# Patient Record
Sex: Female | Born: 1990 | Race: White | Hispanic: No | Marital: Married | Smoking: Former smoker
Health system: Southern US, Community
[De-identification: ages and names within clinical notes are randomized; demographics above are authoritative.]

## PROBLEM LIST (undated history)

## (undated) ENCOUNTER — Inpatient Hospital Stay (HOSPITAL_COMMUNITY): Payer: Self-pay

## (undated) ENCOUNTER — Inpatient Hospital Stay (HOSPITAL_COMMUNITY): Payer: Managed Care, Other (non HMO)

## (undated) DIAGNOSIS — I10 Essential (primary) hypertension: Secondary | ICD-10-CM

## (undated) DIAGNOSIS — Z789 Other specified health status: Secondary | ICD-10-CM

## (undated) HISTORY — PX: CHOLECYSTECTOMY: SHX55

## (undated) HISTORY — PX: NO PAST SURGERIES: SHX2092

---

## 2013-03-30 ENCOUNTER — Ambulatory Visit: Payer: Self-pay | Admitting: Nurse Practitioner

## 2013-04-28 ENCOUNTER — Ambulatory Visit: Payer: Self-pay | Admitting: Nurse Practitioner

## 2016-05-30 LAB — OB RESULTS CONSOLE ABO/RH: RH TYPE: POSITIVE

## 2016-05-30 LAB — OB RESULTS CONSOLE RUBELLA ANTIBODY, IGM: Rubella: IMMUNE

## 2016-05-30 LAB — OB RESULTS CONSOLE GC/CHLAMYDIA
Chlamydia: NEGATIVE
GC PROBE AMP, GENITAL: NEGATIVE

## 2016-05-30 LAB — OB RESULTS CONSOLE ANTIBODY SCREEN: Antibody Screen: NEGATIVE

## 2016-05-30 LAB — OB RESULTS CONSOLE HEPATITIS B SURFACE ANTIGEN: Hepatitis B Surface Ag: NEGATIVE

## 2016-05-30 LAB — OB RESULTS CONSOLE HIV ANTIBODY (ROUTINE TESTING): HIV: NONREACTIVE

## 2016-05-30 LAB — OB RESULTS CONSOLE RPR: RPR: NONREACTIVE

## 2016-09-21 ENCOUNTER — Encounter (HOSPITAL_COMMUNITY): Payer: Self-pay | Admitting: *Deleted

## 2016-09-21 ENCOUNTER — Inpatient Hospital Stay (HOSPITAL_COMMUNITY)
Admission: AD | Admit: 2016-09-21 | Discharge: 2016-09-21 | Disposition: A | Discharge status: Home / Self Care | Payer: Managed Care, Other (non HMO) | Source: Ambulatory Visit | Attending: Obstetrics and Gynecology | Admitting: Obstetrics and Gynecology

## 2016-09-21 DIAGNOSIS — Z87891 Personal history of nicotine dependence: Secondary | ICD-10-CM | POA: Diagnosis not present

## 2016-09-21 DIAGNOSIS — Z3A24 24 weeks gestation of pregnancy: Secondary | ICD-10-CM | POA: Diagnosis not present

## 2016-09-21 DIAGNOSIS — N898 Other specified noninflammatory disorders of vagina: Secondary | ICD-10-CM | POA: Diagnosis not present

## 2016-09-21 DIAGNOSIS — O26892 Other specified pregnancy related conditions, second trimester: Secondary | ICD-10-CM | POA: Diagnosis not present

## 2016-09-21 HISTORY — DX: Other specified health status: Z78.9

## 2016-09-21 LAB — WET PREP, GENITAL
Sperm: NONE SEEN
TRICH WET PREP: NONE SEEN
YEAST WET PREP: NONE SEEN

## 2016-09-21 LAB — AMNISURE RUPTURE OF MEMBRANE (ROM) NOT AT ARMC: AMNISURE: NEGATIVE

## 2016-09-21 NOTE — Discharge Instructions (Signed)

## 2016-09-21 NOTE — MAU Note (Signed)
Pt reports that this morning when she got out of the show she felt something leaking out. Went all day withou it doing it again until tonight. She stated  ti felt like she urinated on her self. Denies any pain or cramping at this time.

## 2016-09-21 NOTE — MAU Provider Note (Signed)
Chief Complaint:  Vaginal Discharge   First Provider Initiated Contact with Patient 09/21/16 2208      HPI: Stephanie Wilkinson is a 25 y.o. G1P0 at 30w6dwho presents to maternity admissions reporting leakage of a few drops of clear fluid from the vagina this morning and again this evening.  She reports the fluid was clear without odor and did not require a pad or pantyliner because the amount was small.  She has not tried any treatments, and nothing makes it better or worse.  She denies any cramping or contractions. She reports good fetal movement, denies vaginal bleeding, vaginal itching/burning, urinary symptoms, h/a, dizziness, n/v, or fever/chills.    HPI  Past Medical History: Past Medical History:  Diagnosis Date  . Medical history non-contributory     Past obstetric history: OB History  Gravida Para Term Preterm AB Living  1            SAB TAB Ectopic Multiple Live Births               # Outcome Date GA Lbr Len/2nd Weight Sex Delivery Anes PTL Lv  1 Current               Past Surgical History: Past Surgical History:  Procedure Laterality Date  . NO PAST SURGERIES      Family History: No family history on file.  Social History: Social History  Substance Use Topics  . Smoking status: Former Smoker    Quit date: 08/24/2012  . Smokeless tobacco: Never Used  . Alcohol use No    Allergies: Allergies not on file  Meds:  No prescriptions prior to admission.    ROS:  Review of Systems  Constitutional: Negative for chills, fatigue and fever.  Eyes: Negative for visual disturbance.  Respiratory: Negative for shortness of breath.   Cardiovascular: Negative for chest pain.  Gastrointestinal: Negative for abdominal pain, nausea and vomiting.  Genitourinary: Positive for vaginal discharge. Negative for difficulty urinating, dysuria, flank pain, pelvic pain, vaginal bleeding and vaginal pain.  Neurological: Negative for dizziness and headaches.   Psychiatric/Behavioral: Negative.      I have reviewed patient's Past Medical Hx, Surgical Hx, Family Hx, Social Hx, medications and allergies.   Physical Exam   Patient Vitals for the past 24 hrs:  BP Temp Pulse Resp Height Weight  09/21/16 2314 131/67 - 69 18 - -  09/21/16 2135 132/76 - - - - -  09/21/16 2132 - 98.6 F (37 C) 70 18 - -  09/21/16 2121 - - - - 5\' 10"  (1.778 m) 254 lb 1.9 oz (115.3 kg)   Constitutional: Well-developed, well-nourished female in no acute distress.  Cardiovascular: normal rate Respiratory: normal effort GI: Abd soft, non-tender, gravid appropriate for gestational age.  MS: Extremities nontender, no edema, normal ROM Neurologic: Alert and oriented x 4.  GU: Neg CVAT.  PELVIC EXAM: Cervix pink, visually closed, without lesion, scant white creamy discharge, vaginal walls and external genitalia normal.  No pooling of fluid with valsalva.  Amnisure collected.  Dilation: Closed Effacement (%): Thick Cervical Position: Posterior Exam by:: L.Leftwich-Kirby,CNM Dilation: Closed Effacement (%): Thick Cervical Position: Posterior Exam by:: L.Leftwich-Kirby,CNM  FHT:  Baseline 140 , moderate variability, accelerations present, no decelerations Contractions: none on toco or to palpation   Labs: Results for orders placed or performed during the hospital encounter of 09/21/16 (from the past 24 hour(s))  Wet prep, genital     Status: Abnormal   Collection Time: 09/21/16 10:10  PM  Result Value Ref Range   Yeast Wet Prep HPF POC NONE SEEN NONE SEEN   Trich, Wet Prep NONE SEEN NONE SEEN   Clue Cells Wet Prep HPF POC PRESENT (A) NONE SEEN   WBC, Wet Prep HPF POC MODERATE (A) NONE SEEN   Sperm NONE SEEN   Amnisure rupture of membrane (rom)[facility]     Status: None   Collection Time: 09/21/16 10:10 PM  Result Value Ref Range   Amnisure ROM NEGATIVE       Imaging:  No results found.  MAU Course/MDM: I have ordered labs and reviewed results.  No  evidence of PROM today.  Consult Dr Claiborne Billingsallahan.  Reassurance provided to pt.  Preterm labor precautions reviewed.  Pt stable at time of discharge.  Assessment: 1. Vaginal discharge during pregnancy in second trimester     Plan: Discharge home Labor precautions and fetal kick counts  Follow-up Information    CALLAHAN, SIDNEY, DO .   Specialty:  Obstetrics and Gynecology Contact information: [address] [place] [address] [phone]            Medication List    You have not been prescribed any medications.     Sharen CounterLisa Leftwich-Kirby Certified Nurse-Midwife 09/22/2016 1:58 AM

## 2016-09-21 NOTE — MAU Note (Signed)
Urine in lab 

## 2016-12-07 LAB — OB RESULTS CONSOLE GBS: STREP GROUP B AG: POSITIVE

## 2016-12-24 NOTE — L&D Delivery Note (Signed)
Delivery Note Patient pushed well for 1 hr 45 minutes.  At 12:56 AM a viable female was delivered via Vaginal, Spontaneous Delivery (Presentation: occiput anterior  ).  APGAR: 8, 9; weight pending Placenta status: spontaneous Cord: 3V  with the following complications: none.  Cord pH: n/a  There were bilateral sulcal lacerations that extended laterally to both labia and to a second degree perineal lacerations.  The anal sphincter was in tact.  There was significant labial and vaginal edema.  There continued to be a slow ooze from the vaginal mucosa after repair, so vaginal packing with kerlex was placed.  A foley catheter was placed to avoid urinary retention.   Anesthesia:  Epidural Episiotomy: None Lacerations:sulcal, 2nd degree perineal laceration Suture Repair: 2.0 3.0 vicryl rapide Est. Blood Loss (mL):  500 mL  Mom to postpartum.  Baby to Couplet care / Skin to Skin.  Stephanie Wilkinson Stephanie Wilkinson 01/12/2017, 2:01 AM

## 2017-01-11 ENCOUNTER — Inpatient Hospital Stay (HOSPITAL_COMMUNITY)
Admission: RE | Admit: 2017-01-11 | Discharge: 2017-01-13 | DRG: 775 | Disposition: A | Discharge status: Home / Self Care | Payer: Managed Care, Other (non HMO) | Source: Ambulatory Visit | Attending: Obstetrics & Gynecology | Admitting: Obstetrics & Gynecology

## 2017-01-11 ENCOUNTER — Inpatient Hospital Stay (HOSPITAL_COMMUNITY): Payer: Managed Care, Other (non HMO) | Admitting: Anesthesiology

## 2017-01-11 ENCOUNTER — Encounter (HOSPITAL_COMMUNITY): Payer: Self-pay

## 2017-01-11 DIAGNOSIS — Z87891 Personal history of nicotine dependence: Secondary | ICD-10-CM

## 2017-01-11 DIAGNOSIS — Z6839 Body mass index (BMI) 39.0-39.9, adult: Secondary | ICD-10-CM

## 2017-01-11 DIAGNOSIS — Z3490 Encounter for supervision of normal pregnancy, unspecified, unspecified trimester: Secondary | ICD-10-CM

## 2017-01-11 DIAGNOSIS — O9962 Diseases of the digestive system complicating childbirth: Secondary | ICD-10-CM | POA: Diagnosis present

## 2017-01-11 DIAGNOSIS — K219 Gastro-esophageal reflux disease without esophagitis: Secondary | ICD-10-CM | POA: Diagnosis present

## 2017-01-11 DIAGNOSIS — O99824 Streptococcus B carrier state complicating childbirth: Secondary | ICD-10-CM | POA: Diagnosis present

## 2017-01-11 DIAGNOSIS — O99214 Obesity complicating childbirth: Secondary | ICD-10-CM | POA: Diagnosis present

## 2017-01-11 DIAGNOSIS — O480 Post-term pregnancy: Principal | ICD-10-CM | POA: Diagnosis present

## 2017-01-11 DIAGNOSIS — Z3A40 40 weeks gestation of pregnancy: Secondary | ICD-10-CM

## 2017-01-11 HISTORY — DX: Essential (primary) hypertension: I10

## 2017-01-11 LAB — PROTEIN / CREATININE RATIO, URINE
Creatinine, Urine: 83 mg/dL
PROTEIN CREATININE RATIO: 0.08 mg/mg{creat} (ref 0.00–0.15)
Total Protein, Urine: 7 mg/dL

## 2017-01-11 LAB — COMPREHENSIVE METABOLIC PANEL
ALBUMIN: 3.1 g/dL — AB (ref 3.5–5.0)
ALK PHOS: 101 U/L (ref 38–126)
ALT: 18 U/L (ref 14–54)
AST: 17 U/L (ref 15–41)
Anion gap: 7 (ref 5–15)
BUN: 7 mg/dL (ref 6–20)
CALCIUM: 9.3 mg/dL (ref 8.9–10.3)
CHLORIDE: 104 mmol/L (ref 101–111)
CO2: 23 mmol/L (ref 22–32)
CREATININE: 0.65 mg/dL (ref 0.44–1.00)
GFR calc Af Amer: >60 mL/min (ref >60)
GFR calc non Af Amer: >60 mL/min (ref >60)
GLUCOSE: 103 mg/dL — AB (ref 65–99)
Potassium: 3.6 mmol/L (ref 3.5–5.1)
SODIUM: 134 mmol/L — AB (ref 135–145)
Total Bilirubin: 0.4 mg/dL (ref 0.3–1.2)
Total Protein: 6.7 g/dL (ref 6.5–8.1)

## 2017-01-11 LAB — TYPE AND SCREEN
ABO/RH(D): O POS
Antibody Screen: NEGATIVE

## 2017-01-11 LAB — CBC
HCT: 33 % — ABNORMAL LOW (ref 36.0–46.0)
Hemoglobin: 11.7 g/dL — ABNORMAL LOW (ref 12.0–15.0)
MCH: 29.8 pg (ref 26.0–34.0)
MCHC: 35.5 g/dL (ref 30.0–36.0)
MCV: 84 fL (ref 78.0–100.0)
PLATELETS: 202 10*3/uL (ref 150–400)
RBC: 3.93 MIL/uL (ref 3.87–5.11)
RDW: 13.8 % (ref 11.5–15.5)
WBC: 10.3 10*3/uL (ref 4.0–10.5)

## 2017-01-11 LAB — LACTATE DEHYDROGENASE: LDH: 142 U/L (ref 98–192)

## 2017-01-11 LAB — ABO/RH: ABO/RH(D): O POS

## 2017-01-11 LAB — URIC ACID: Uric Acid, Serum: 4.7 mg/dL (ref 2.3–6.6)

## 2017-01-11 MED ORDER — OXYTOCIN BOLUS FROM INFUSION
500.0000 mL | Freq: Once | INTRAVENOUS | Status: AC
  Administered 2017-01-12: 500 mL via INTRAVENOUS

## 2017-01-11 MED ORDER — ONDANSETRON HCL 4 MG/2ML IJ SOLN
4.0000 mg | Freq: Four times a day (QID) | INTRAMUSCULAR | Status: DC | PRN
  Administered 2017-01-11: 4 mg via INTRAVENOUS
  Filled 2017-01-11: qty 2

## 2017-01-11 MED ORDER — LACTATED RINGERS IV SOLN
INTRAVENOUS | Status: DC
  Administered 2017-01-11 (×3): via INTRAVENOUS

## 2017-01-11 MED ORDER — PHENYLEPHRINE 40 MCG/ML (10ML) SYRINGE FOR IV PUSH (FOR BLOOD PRESSURE SUPPORT)
PREFILLED_SYRINGE | INTRAVENOUS | Status: AC
  Filled 2017-01-11: qty 20

## 2017-01-11 MED ORDER — PHENYLEPHRINE 40 MCG/ML (10ML) SYRINGE FOR IV PUSH (FOR BLOOD PRESSURE SUPPORT)
80.0000 ug | PREFILLED_SYRINGE | INTRAVENOUS | Status: DC | PRN
  Filled 2017-01-11: qty 5

## 2017-01-11 MED ORDER — LACTATED RINGERS IV SOLN
500.0000 mL | Freq: Once | INTRAVENOUS | Status: AC
  Administered 2017-01-11: 500 mL via INTRAVENOUS

## 2017-01-11 MED ORDER — MISOPROSTOL 25 MCG QUARTER TABLET
25.0000 ug | ORAL_TABLET | ORAL | Status: DC | PRN
  Administered 2017-01-11: 25 ug via VAGINAL
  Filled 2017-01-11: qty 1
  Filled 2017-01-11: qty 0.25

## 2017-01-11 MED ORDER — FENTANYL 2.5 MCG/ML BUPIVACAINE 1/10 % EPIDURAL INFUSION (WH - ANES)
INTRAMUSCULAR | Status: AC
  Filled 2017-01-11: qty 100

## 2017-01-11 MED ORDER — OXYCODONE-ACETAMINOPHEN 5-325 MG PO TABS: 1.0000 | ORAL_TABLET | ORAL | Status: DC | PRN

## 2017-01-11 MED ORDER — BUTORPHANOL TARTRATE 1 MG/ML IJ SOLN: 1.0000 mg | INTRAMUSCULAR | Status: DC | PRN

## 2017-01-11 MED ORDER — OXYTOCIN 40 UNITS IN LACTATED RINGERS INFUSION - SIMPLE MED
2.5000 [IU]/h | INTRAVENOUS | Status: DC
Start: 2017-01-11 — End: 2017-01-12
  Administered 2017-01-12: 2.5 [IU]/h via INTRAVENOUS

## 2017-01-11 MED ORDER — PENICILLIN G POTASSIUM 5000000 UNITS IJ SOLR
5.0000 10*6.[IU] | Freq: Once | INTRAVENOUS | Status: AC
  Administered 2017-01-11: 5 10*6.[IU] via INTRAVENOUS
  Filled 2017-01-11: qty 5

## 2017-01-11 MED ORDER — OXYTOCIN 40 UNITS IN LACTATED RINGERS INFUSION - SIMPLE MED
1.0000 m[IU]/min | INTRAVENOUS | Status: DC
  Administered 2017-01-11: 2 m[IU]/min via INTRAVENOUS
  Filled 2017-01-11: qty 1000

## 2017-01-11 MED ORDER — SOD CITRATE-CITRIC ACID 500-334 MG/5ML PO SOLN: 30.0000 mL | ORAL | Status: DC | PRN

## 2017-01-11 MED ORDER — PENICILLIN G POT IN DEXTROSE 60000 UNIT/ML IV SOLN
3.0000 10*6.[IU] | INTRAVENOUS | Status: DC
  Administered 2017-01-11 (×3): 3 10*6.[IU] via INTRAVENOUS
  Filled 2017-01-11 (×9): qty 50

## 2017-01-11 MED ORDER — FENTANYL 2.5 MCG/ML BUPIVACAINE 1/10 % EPIDURAL INFUSION (WH - ANES)
14.0000 mL/h | INTRAMUSCULAR | Status: DC | PRN
  Administered 2017-01-11 – 2017-01-12 (×3): 14 mL/h via EPIDURAL
  Filled 2017-01-11: qty 100

## 2017-01-11 MED ORDER — OXYCODONE-ACETAMINOPHEN 5-325 MG PO TABS: 2.0000 | ORAL_TABLET | ORAL | Status: DC | PRN

## 2017-01-11 MED ORDER — TERBUTALINE SULFATE 1 MG/ML IJ SOLN
0.2500 mg | Freq: Once | INTRAMUSCULAR | Status: DC | PRN
  Filled 2017-01-11: qty 1

## 2017-01-11 MED ORDER — LACTATED RINGERS IV SOLN: 500.0000 mL | INTRAVENOUS | Status: DC | PRN

## 2017-01-11 MED ORDER — EPHEDRINE 5 MG/ML INJ
10.0000 mg | INTRAVENOUS | Status: DC | PRN
  Filled 2017-01-11: qty 4

## 2017-01-11 MED ORDER — ACETAMINOPHEN 325 MG PO TABS
650.0000 mg | ORAL_TABLET | ORAL | Status: DC | PRN
  Administered 2017-01-11: 650 mg via ORAL
  Filled 2017-01-11: qty 2

## 2017-01-11 MED ORDER — LIDOCAINE HCL (PF) 1 % IJ SOLN
INTRAMUSCULAR | Status: DC | PRN
  Administered 2017-01-11 (×2): 7 mL via EPIDURAL

## 2017-01-11 MED ORDER — DIPHENHYDRAMINE HCL 50 MG/ML IJ SOLN: 12.5000 mg | INTRAMUSCULAR | Status: DC | PRN

## 2017-01-11 MED ORDER — LIDOCAINE HCL (PF) 1 % IJ SOLN
30.0000 mL | INTRAMUSCULAR | Status: DC | PRN
  Filled 2017-01-11: qty 30

## 2017-01-11 NOTE — Anesthesia Preprocedure Evaluation (Signed)
Anesthesia Evaluation  Patient identified by MRN, date of birth, ID band Patient awake    Reviewed: Allergy & Precautions, Patient's Chart, lab work & pertinent test results  Airway Mallampati: III  TM Distance: >3 FB Neck ROM: Full    Dental no notable dental hx. (+) Teeth Intact   Pulmonary former smoker,    Pulmonary exam normal breath sounds clear to auscultation       Cardiovascular hypertension, Normal cardiovascular exam Rhythm:Regular Rate:Normal     Neuro/Psych negative neurological ROS  negative psych ROS   GI/Hepatic Neg liver ROS, GERD  Medicated and Controlled,  Endo/Other  Morbid obesity  Renal/GU negative Renal ROS  negative genitourinary   Musculoskeletal negative musculoskeletal ROS (+)   Abdominal (+) + obese,   Peds  Hematology  (+) anemia ,   Anesthesia Other Findings   Reproductive/Obstetrics (+) Pregnancy                             Anesthesia Physical Anesthesia Plan  ASA: II  Anesthesia Plan: Epidural   Post-op Pain Management:    Induction:   Airway Management Planned: Natural Airway  Additional Equipment:   Intra-op Plan:   Post-operative Plan:   Informed Consent: I have reviewed the patients History and Physical, chart, labs and discussed the procedure including the risks, benefits and alternatives for the proposed anesthesia with the patient or authorized representative who has indicated his/her understanding and acceptance.     Plan Discussed with: Anesthesiologist  Anesthesia Plan Comments:         Anesthesia Quick Evaluation

## 2017-01-11 NOTE — Progress Notes (Signed)
ST- Stephanie Wilkinson

## 2017-01-11 NOTE — Anesthesia Procedure Notes (Signed)
Epidural Patient location during procedure: OB Start time: 01/11/2017 6:20 PM End time: 01/11/2017 6:24 PM  Staffing Anesthesiologist: Leilani AbleHATCHETT, Leshae Mcclay Performed: anesthesiologist   Preanesthetic Checklist Completed: patient identified, surgical consent, pre-op evaluation, timeout performed, IV checked, risks and benefits discussed and monitors and equipment checked  Epidural Patient position: sitting Prep: site prepped and draped and DuraPrep Patient monitoring: continuous pulse ox and blood pressure Approach: midline Location: L3-L4 Injection technique: LOR air  Needle:  Needle type: Tuohy  Needle gauge: 17 G Needle length: 9 cm and 9 Needle insertion depth: 6 cm Catheter type: closed end flexible Catheter size: 19 Gauge Catheter at skin depth: 11 cm Test dose: negative and Other  Assessment Sensory level: T9 Events: blood not aspirated, injection not painful, no injection resistance, negative IV test and no paresthesia  Additional Notes Reason for block:procedure for pain

## 2017-01-11 NOTE — H&P (Signed)
Rhodia Albrightmily B Hensley-Young is a 26 y.o. female presenting for scheduled induction of labor for post dates. She is not having regular ctx, no leaking of fluid, no vaginal bleeding, reports good fetal movement.   OB History    Gravida Para Term Preterm AB Living   1             SAB TAB Ectopic Multiple Live Births                 Past Medical History:  Diagnosis Date  . Hypertension   . Medical history non-contributory    Past Surgical History:  Procedure Laterality Date  . NO PAST SURGERIES     Family History: family history is not on file. Social History:  reports that she quit smoking about 4 years ago. She has never used smokeless tobacco. She reports that she does not drink alcohol or use drugs.     Maternal Diabetes: No Genetic Screening: Declined Maternal Ultrasounds/Referrals: Normal Fetal Ultrasounds or other Referrals:  Other:  Anatomy scan normal  Maternal Substance Abuse:  No Significant Maternal Medications:  None Significant Maternal Lab Results:  Lab values include: Group B Strep positive Other Comments:  None  Review of Systems  Constitutional: Negative for fever.  Eyes: Negative for blurred vision and double vision.  Cardiovascular: Negative for chest pain.  Neurological: Negative for headaches.  All other systems reviewed and are negative.  Maternal Medical History:  Contractions: Frequency: rare.   Perceived severity is mild.    Fetal activity: Perceived fetal activity is normal.   Last perceived fetal movement was within the past 24 hours.    Prenatal complications: no prenatal complications Prenatal Complications - Diabetes: none.      There were no vitals taken for this visit. Maternal Exam:  Uterine Assessment: Contraction strength is mild.  Contraction frequency is rare.   Abdomen: Patient reports no abdominal tenderness. Fundal height is 41 cm.   Estimated fetal weight is 4000 grams.   Fetal presentation: vertex  Introitus: Normal vulva.  Normal vagina.  Ferning test: not done.  Nitrazine test: not done. Amniotic fluid character: not assessed.  Pelvis: questionable for delivery.   Cervix: Cervix evaluated by digital exam.   Last check in office by MD: 1cm/ thick/-3  Fetal Exam Fetal Monitor Review: Baseline rate: 145.  Variability: minimal (<5 bpm).   Pattern: no accelerations and no decelerations.    Fetal State Assessment: Category II - tracings are indeterminate.     Physical Exam  Vitals reviewed. Constitutional: She appears well-developed and well-nourished.    Prenatal labs: ABO, Rh: O/Positive/-- (06/07 0000) Antibody: Negative (06/07 0000) Rubella: Immune (06/07 0000) RPR: Nonreactive (06/07 0000)  HBsAg: Negative (06/07 0000)  HIV: Non-reactive (06/07 0000)  GBS: Positive (12/15 0000)   Assessment/Plan: 26 yo G1P0 at 40 weeks 6 days for elective induction at term Admit to L&D Continuous monitoring Epidural on demand Misoprostol for cervical ripening   Verna Desrocher STACIA 01/11/2017, 12:43 AM

## 2017-01-11 NOTE — Anesthesia Pain Management Evaluation Note (Signed)
  CRNA Pain Management Visit Note  Patient: Stephanie Wilkinson, 26 y.o., female  "Hello I am a [facility] of the anesthesia team at [facility]. We have an anesthesia team available at all times to provide care throughout the hospital, including epidural management and anesthesia for C-section. I don't know your plan for the delivery whether it a natural birth, water birth, IV sedation, nitrous supplementation, doula or epidural, but we want to meet your pain goals."   1.Was your pain managed to your expectations on prior hospitalizations?   No prior hospitalizations  2.What is your expectation for pain management during this hospitalization?     Epidural  3.How can we help you reach that goal? unsure  Record the patient's initial score and the patient's pain goal.   Pain: 3  Pain Goal: 6 [facility] wants you to be able to say your pain was always managed very well.  Cephus ShellingBURGER,Dalaney Needle 01/11/2017

## 2017-01-11 NOTE — Progress Notes (Signed)
Pt seen and examined. G1 @ 570w6d here for IOL for post-term pregnancy.  Bps noted to be elevated in 140-150/80-90s since admission.  No elevated BPs during this pregnancy, but reports remote h/o HTN that self resolved.  Denies HA/BV/RUQ pain.  Received 1 dose of cytotec overnight.  Was contracting q3 minutes and unable to place 2nd dose.  FB was placed without difficulty and filled w 60cc normal saline.    BP (!) 150/85   Pulse 69   Temp 98.3 F (36.8 C) (Oral)   Resp 20   Ht 5\' 10"  (1.778 m)   Wt 123.8 kg (273 lb)   BMI 39.17 kg/m   Abd: soft, gravid.  EFW 8.5# Toco: q2-3 minutes EFM: 130s, mod var, + accels, no decels SVE: 1-2/50/-3 prior to FB placement  G1 @ 5270w6d w IOL for post-term pregnancy IOL--FB in place Elevated BPs--urine p:c, preeclampsia labs wnl.  Will monitor closely GBS+--pcn

## 2017-01-12 ENCOUNTER — Encounter (HOSPITAL_COMMUNITY): Payer: Self-pay

## 2017-01-12 LAB — CBC
HEMATOCRIT: 27.9 % — AB (ref 36.0–46.0)
HEMOGLOBIN: 9.8 g/dL — AB (ref 12.0–15.0)
MCH: 30.2 pg (ref 26.0–34.0)
MCHC: 35.1 g/dL (ref 30.0–36.0)
MCV: 85.8 fL (ref 78.0–100.0)
Platelets: 168 10*3/uL (ref 150–400)
RBC: 3.25 MIL/uL — ABNORMAL LOW (ref 3.87–5.11)
RDW: 13.8 % (ref 11.5–15.5)
WBC: 15.4 10*3/uL — ABNORMAL HIGH (ref 4.0–10.5)

## 2017-01-12 LAB — RPR: RPR: NONREACTIVE

## 2017-01-12 MED ORDER — ONDANSETRON HCL 4 MG/2ML IJ SOLN: 4.0000 mg | INTRAMUSCULAR | Status: DC | PRN

## 2017-01-12 MED ORDER — SENNOSIDES-DOCUSATE SODIUM 8.6-50 MG PO TABS
2.0000 | ORAL_TABLET | ORAL | Status: DC
  Administered 2017-01-13: 2 via ORAL
  Filled 2017-01-12 (×2): qty 2

## 2017-01-12 MED ORDER — COCONUT OIL OIL: 1.0000 "application " | TOPICAL_OIL | Status: DC | PRN

## 2017-01-12 MED ORDER — BENZOCAINE-MENTHOL 20-0.5 % EX AERO
1.0000 "application " | INHALATION_SPRAY | CUTANEOUS | Status: DC | PRN
  Filled 2017-01-12: qty 56

## 2017-01-12 MED ORDER — PRENATAL MULTIVITAMIN CH
1.0000 | ORAL_TABLET | Freq: Every day | ORAL | Status: DC
  Administered 2017-01-12: 1 via ORAL
  Filled 2017-01-12: qty 1

## 2017-01-12 MED ORDER — IBUPROFEN 600 MG PO TABS
600.0000 mg | ORAL_TABLET | Freq: Four times a day (QID) | ORAL | Status: DC
  Administered 2017-01-12 – 2017-01-13 (×4): 600 mg via ORAL
  Filled 2017-01-12 (×5): qty 1

## 2017-01-12 MED ORDER — ACETAMINOPHEN 325 MG PO TABS
650.0000 mg | ORAL_TABLET | ORAL | Status: DC | PRN
  Administered 2017-01-13: 650 mg via ORAL
  Filled 2017-01-12: qty 2

## 2017-01-12 MED ORDER — ONDANSETRON HCL 4 MG PO TABS: 4.0000 mg | ORAL_TABLET | ORAL | Status: DC | PRN

## 2017-01-12 MED ORDER — WITCH HAZEL-GLYCERIN EX PADS: 1.0000 "application " | MEDICATED_PAD | CUTANEOUS | Status: DC | PRN

## 2017-01-12 MED ORDER — SIMETHICONE 80 MG PO CHEW: 80.0000 mg | CHEWABLE_TABLET | ORAL | Status: DC | PRN

## 2017-01-12 MED ORDER — OXYCODONE HCL 5 MG PO TABS
5.0000 mg | ORAL_TABLET | ORAL | Status: DC | PRN
  Administered 2017-01-12: 5 mg via ORAL
  Filled 2017-01-12: qty 1

## 2017-01-12 MED ORDER — OXYCODONE HCL 5 MG PO TABS
10.0000 mg | ORAL_TABLET | ORAL | Status: DC | PRN
  Administered 2017-01-12: 10 mg via ORAL
  Filled 2017-01-12: qty 2

## 2017-01-12 MED ORDER — DIPHENHYDRAMINE HCL 25 MG PO CAPS: 25.0000 mg | ORAL_CAPSULE | Freq: Four times a day (QID) | ORAL | Status: DC | PRN

## 2017-01-12 MED ORDER — TETANUS-DIPHTH-ACELL PERTUSSIS 5-2.5-18.5 LF-MCG/0.5 IM SUSP: 0.5000 mL | Freq: Once | INTRAMUSCULAR | Status: DC

## 2017-01-12 MED ORDER — DIBUCAINE 1 % RE OINT
1.0000 | TOPICAL_OINTMENT | RECTAL | Status: DC | PRN
Start: 2017-01-12 — End: 2017-01-13

## 2017-01-12 NOTE — Lactation Note (Signed)
This note was copied from a baby's chart. Lactation Consultation Note  Assisted mom with laid back and football position. Many swallows noted, Mom showed how to perform breast compression to aid in transfer. Cue based feeding taught as was expected output for the first 24 hours.  Information given on support groups and outpatient. Follow-up tomorrow or sooner if needed. Patient Name: Stephanie Wilkinson [id number]'WToday's Date: 01/12/2017 Reason for consult: Initial assessment   Maternal Data Has patient been taught Hand Expression?: Yes Does the patient have breastfeeding experience prior to this delivery?: No  Feeding Feeding Type: Breast Fed  LATCH Score/Interventions Latch: Repeated attempts needed to sustain latch, nipple held in mouth throughout feeding, stimulation needed to elicit sucking reflex.  Audible Swallowing: A few with stimulation  Type of Nipple: Everted at rest and after stimulation  Comfort (Breast/Nipple): Soft / non-tender     Hold (Positioning): Assistance needed to correctly position infant at breast and maintain latch.  LATCH Score: 7  Lactation Tools Discussed/Used     Consult Status Consult Status: Follow-up Date: 01/13/17 Follow-up type: In-patient    Stephanie Wilkinson, Stephanie Wilkinson 01/12/2017, 3:38 PM

## 2017-01-12 NOTE — Anesthesia Postprocedure Evaluation (Signed)
Anesthesia Post Note  Patient: Stephanie Wilkinson  Procedure(s) Performed: * No procedures listed *  Patient location during evaluation: Mother Baby Anesthesia Type: Epidural Level of consciousness: awake and alert Pain management: pain level controlled Vital Signs Assessment: post-procedure vital signs reviewed and stable Respiratory status: spontaneous breathing Cardiovascular status: stable Postop Assessment: no headache, no backache, epidural receding, patient able to bend at knees, no signs of nausea or vomiting and adequate PO intake Anesthetic complications: no        Last Vitals:  Vitals:   01/12/17 0315 01/12/17 0415  BP: 134/71 (!) 143/73  Pulse: 94 92  Resp: 18 18  Temp: 36.8 C 37.1 C    Last Pain:  Vitals:   01/12/17 0415  TempSrc: Oral  PainSc: 0-No pain   Pain Goal: Patients Stated Pain Goal: 6 (01/11/17 1730)               Laban EmperorMalinova,Trapper Meech Hristova

## 2017-01-12 NOTE — Progress Notes (Signed)
Patient is doing well.  She has not yet ambulated.  Foley catheter and vaginal packing in place--peripad clear.  Pain control is good.   Vitals:   01/12/17 0250 01/12/17 0315 01/12/17 0415 01/12/17 0830  BP: 133/70 134/71 (!) 143/73 130/69  Pulse: 100 94 92 79  Resp: 18 18 18 18   Temp:  98.3 F (36.8 C) 98.8 F (37.1 C) 98.7 F (37.1 C)  TempSrc:   Oral Oral  Weight:      Height:        NAD Fundus firm Pelvic: vaginal packing removed.  Moderate edema, decreased Ext: 1+ bilaterally  Lab Results  Component Value Date   WBC 15.4 (H) 01/12/2017   HGB 9.8 (L) 01/12/2017   HCT 27.9 (L) 01/12/2017   MCV 85.8 01/12/2017   PLT 168 01/12/2017    --/--/O POS, O POS (01/19 0050)/RImmune  A/P 25 y.o. G1P1001 PPD#0 s/p TSVD Vaginal packing removed.  Edema is decreased--will remove foley catheter. Breastfeeding  Plan for circumcision tomorrow.  Desires circumcision. Discussed r/b/a of the procedure. Reviewed that circumcision is an elective surgical procedure and not considered medically necessary. Reviewed the risks of the procedure including the risk of infection, bleeding, damage to surrounding structures, including scrotum, shaft, urethra and head of penis, and an undesired cosmetic effect requiring additional procedures for revision. Consent signed.      [facility] GEFFEL [employer]

## 2017-01-13 MED ORDER — OXYCODONE HCL 5 MG PO TABS: 5.0000 mg | ORAL_TABLET | ORAL | 0 refills | Status: DC | PRN

## 2017-01-13 MED ORDER — IBUPROFEN 600 MG PO TABS: 600.0000 mg | ORAL_TABLET | Freq: Four times a day (QID) | ORAL | 0 refills | Status: DC

## 2017-01-13 NOTE — Discharge Summary (Signed)
Obstetric Discharge Summary Reason for Admission: induction of labor Prenatal Procedures: none Intrapartum Procedures: spontaneous vaginal delivery Postpartum Procedures: none Complications-Operative and Postpartum: 2 degree perineal laceration and vaginal laceration Hemoglobin  Date Value Ref Range Status  01/12/2017 9.8 (L) 12.0 - 15.0 g/dL Final   HCT  Date Value Ref Range Status  01/12/2017 27.9 (L) 36.0 - 46.0 % Final    Physical Exam:  General: alert, cooperative and appears stated age Lochia: appropriate Uterine Fundus: firm Incision: healing well DVT Evaluation: Negative Homan's sign.  Discharge Diagnoses: Term Pregnancy-delivered  Discharge Information: Date: 01/13/2017 Activity: pelvic rest Diet: routine Medications: PNV, Ibuprofen and oxycodone Condition: stable Instructions: refer to practice specific booklet Discharge to: home Follow-up Information    [facility] Wilkinson Stephanie Badour, MD Follow up in 4 week(s).   Specialty:  Obstetrics Contact information: [address] [place] [address] [phone]           Newborn Data: Live born female  Birth Weight: 8 lb 2.7 oz (3705 g) APGAR: 8, 9  Home with mother.  Stephanie Wilkinson Stephanie Wilkinson 01/13/2017, 7:07 PM

## 2017-01-13 NOTE — Progress Notes (Signed)
Patient is doing well.  Ambulating and voiding without difficulty.  Pain control is good.  Lochia is appropriate.   Had likely GHTN prior to delivery w BPs 150/90s--Is asymptomatic and BPs mostly 120-130/60-70s since delivery  Vitals:   01/12/17 1642 01/12/17 1854 01/12/17 2100 01/13/17 0626  BP: (!) 147/71 125/83 139/75 121/65  Pulse: 79 (!) 57 81 78  Resp: 18 18  18   Temp: 99.3 F (37.4 C) 97.9 F (36.6 C)  98.2 F (36.8 C)  TempSrc: Oral Oral  Oral  Weight:      Height:        NAD Fundus firm xt: 1+ bilaterally  Lab Results  Component Value Date   WBC 15.4 (H) 01/12/2017   HGB 9.8 (L) 01/12/2017   HCT 27.9 (L) 01/12/2017   MCV 85.8 01/12/2017   PLT 168 01/12/2017    --/--/O POS, O POS (01/19 0050)/RImmune  A/P 26 y.o. G1P1001 PPD#1 s/p TSVD Meeting all goals--desires d/c today.   Circ prior to d/c--consented yesterday      [facility] GEFFEL [place]

## 2017-06-10 ENCOUNTER — Ambulatory Visit: Payer: BLUE CROSS/BLUE SHIELD | Admitting: Family

## 2019-10-15 ENCOUNTER — Other Ambulatory Visit: Payer: Self-pay

## 2019-10-15 DIAGNOSIS — Z20822 Contact with and (suspected) exposure to covid-19: Secondary | ICD-10-CM

## 2019-10-17 LAB — NOVEL CORONAVIRUS, NAA: SARS-CoV-2, NAA: NOT DETECTED

## 2019-11-30 ENCOUNTER — Other Ambulatory Visit: Payer: Self-pay | Admitting: Gastroenterology

## 2019-11-30 DIAGNOSIS — R1011 Right upper quadrant pain: Secondary | ICD-10-CM

## 2019-12-10 ENCOUNTER — Ambulatory Visit
Admission: RE | Admit: 2019-12-10 | Discharge: 2019-12-10 | Disposition: A | Discharge status: Home / Self Care | Payer: Managed Care, Other (non HMO) | Source: Ambulatory Visit | Attending: Gastroenterology | Admitting: Gastroenterology

## 2019-12-10 DIAGNOSIS — R1011 Right upper quadrant pain: Secondary | ICD-10-CM

## 2019-12-30 ENCOUNTER — Ambulatory Visit: Payer: Self-pay | Admitting: Surgery

## 2019-12-30 NOTE — H&P (Signed)
Surgical H&P Requesting provider: Dr Oletta Lamas  CC: abdominal pain  HPI: Very pleasant and otherwise healthy 29 year old woman with cholecystitis.  She had an episode of severe right-sided abdominal pain in November which contracted her to go to the emergency department.  This was indeed in.  Apparently a bedside ultrasound was done and there were not able to visualize stones but noted distention and thickening of the gallbladder.  She was then seen by Dr. Oletta Lamas who has completed a formal right upper quadrant ultrasound which describes a 6 mm calculus originating in the cystic duct. She has since been on a low-fat diet and her symptoms are somewhat well controlled.  A lot of things will give her pain and she does have waves of nausea.  She has lost about 15 pounds throughout this course. She has not had any prior abdominal surgery.  Past Surgical History  No pertinent past surgical history   Diagnostic Studies History  Colonoscopy  never Mammogram  never Pap Smear  1-5 years ago  Allergies No Known Drug Allergies    Medication History Pepcid  (20MG  Tablet, Oral) Active. Medications Reconciled   Social History Alcohol use  Occasional alcohol use. Caffeine use  Coffee. No drug use  Tobacco use  Never smoker.  Family History  Hypertension  Brother, Father, Mother.  Pregnancy / Birth History Age at menarche  73 years. Contraceptive History  Oral contraceptives. Gravida  1 Length (months) of breastfeeding  3-6 Maternal age  48-30 Para  1 Regular periods      Review of Systems General Present- Appetite Loss, Fatigue and Weight Loss. Not Present- Chills, Fever, Night Sweats and Weight Gain. Skin Not Present- Change in Wart/Mole, Dryness, Hives, Jaundice, New Lesions, Non-Healing Wounds, Rash and Ulcer. HEENT Present- Sore Throat. Not Present- Earache, Hearing Loss, Hoarseness, Nose Bleed, Oral Ulcers, Ringing in the Ears, Seasonal Allergies, Sinus Pain, Visual Disturbances,  Wears glasses/contact lenses and Yellow Eyes. Respiratory Not Present- Bloody sputum, Chronic Cough, Difficulty Breathing, Snoring and Wheezing. Breast Not Present- Breast Mass, Breast Pain, Nipple Discharge and Skin Changes. Cardiovascular Not Present- Chest Pain, Difficulty Breathing Lying Down, Leg Cramps, Palpitations, Rapid Heart Rate, Shortness of Breath and Swelling of Extremities. Gastrointestinal Not Present- Abdominal Pain, Bloating, Bloody Stool, Change in Bowel Habits, Chronic diarrhea, Constipation, Difficulty Swallowing, Excessive gas, Gets full quickly at meals, Hemorrhoids, Indigestion, Nausea, Rectal Pain and Vomiting. Female Genitourinary Not Present- Frequency, Nocturia, Painful Urination, Pelvic Pain and Urgency. Musculoskeletal Not Present- Back Pain, Joint Pain, Joint Stiffness, Muscle Pain, Muscle Weakness and Swelling of Extremities. Neurological Not Present- Decreased Memory, Fainting, Headaches, Numbness, Seizures, Tingling, Tremor, Trouble walking and Weakness. Psychiatric Not Present- Anxiety, Bipolar, Change in Sleep Pattern, Depression, Fearful and Frequent crying. Endocrine Not Present- Cold Intolerance, Excessive Hunger, Hair Changes, Heat Intolerance, Hot flashes and New Diabetes. Hematology Not Present- Blood Thinners, Easy Bruising, Excessive bleeding, Gland problems, HIV and Persistent Infections. All other systems negative  Vitals  Weight: 275 lb   Height: 70.5 in  Body Surface Area: 2.4 m   Body Mass Index: 38.9 kg/m   Temp.: 98.3 F    Pulse: 80 (Regular)    BP: 124/82 (Sitting, Left Arm, Standard)        Physical Exam   The physical exam findings are as follows: Note: Gen: alert and well appearing Eye: extraocular motion intact, no scleral icterus Chest: unlabored respirations, symmetrical air entry CV: regular rate and rhythm Abdomen: soft, minimally tender in the right upper quadrant, nondistended. No mass  or organomegaly MSK: strength  symmetrical throughout, no deformity Neuro: grossly intact, normal gait Psych: normal mood and affect, appropriate insight Skin: warm and dry, no rash or lesion on limited exam    Assessment & Plan   CHOLECYSTITIS (K81.9) Story: I recommend proceeding with laparoscopic or robotic cholecystectomy with possible cholangiogram. Discussed risks of surgery including bleeding, pain, scarring, intraabdominal injury specifically to the common bile duct and sequelae, bile leak, conversion to open surgery, failure to resolve symptoms, blood clots/ pulmonary embolus, heart attack, pneumonia, stroke, death. Questions welcomed and answered to patient's satisfaction. She wishes to proceed with surgery.    Phylliss Blakes, MD [facility], [place]  See AMION to contact appropriate on-call provider

## 2020-01-05 ENCOUNTER — Ambulatory Visit: Payer: Self-pay | Admitting: Surgery

## 2020-01-05 NOTE — H&P (Signed)
Stephanie Wilkinson DOB: 1991/01/31 Married / Language: English / Race: White Female   History of Present Illness (Cammi Consalvo A. Fredricka Bonine MD; 12/30/2019 2:17 PM) Patient words: Very pleasant and otherwise healthy 29 year old woman with cholecystitis. She had an episode of severe right-sided abdominal pain in November which contracted her to go to the emergency department. This was in [place]. Apparently a bedside ultrasound was done and they were not able to visualize stones but noted distention and thickening of the gallbladder. She was then seen by Dr. Randa Evens who has completed a formal right upper quadrant ultrasound which describes a 6 mm calculus originating in the cystic duct. She has since been on a low-fat diet and her symptoms are somewhat well controlled. A lot of things will give her pain and she does have waves of nausea. She has lost about 15 pounds throughout this course. She has not had any prior abdominal surgery.   Past Surgical History (Tanisha A. Manson Passey, RMA; 12/30/2019 1:54 PM) No pertinent past surgical history   Diagnostic Studies History (Tanisha A. Manson Passey, RMA; 12/30/2019 1:54 PM) Colonoscopy  never Mammogram  never Pap Smear  1-5 years ago  Allergies (Tanisha A. Manson Passey, RMA; 12/30/2019 1:54 PM) No Known Drug Allergies [12/30/2019]: Allergies Reconciled   Medication History (Tanisha A. Manson Passey, RMA; 12/30/2019 1:54 PM) Pepcid (20MG  Tablet, Oral) Active. Medications Reconciled  Social History (Tanisha A. , RMA; 12/30/2019 1:54 PM) Alcohol use  Occasional alcohol use. Caffeine use  Coffee. No drug use  Tobacco use  Never smoker.  Family History (Tanisha A. 02/27/2020, RMA; 12/30/2019 1:54 PM) Hypertension  Brother, Father, Mother.  Pregnancy / Birth History (Tanisha A. 02/27/2020, RMA; 12/30/2019 1:54 PM) Age at menarche  11 years. Contraceptive History  Oral contraceptives. Gravida  1 Length (months) of breastfeeding  3-6 Maternal age  14-30 Para  1 Regular periods      Review of Systems (Quanetta Truss A. 38-31 MD; 12/30/2019 2:17 PM) General Present- Appetite Loss, Fatigue and Weight Loss. Not Present- Chills, Fever, Night Sweats and Weight Gain. Skin Not Present- Change in Wart/Mole, Dryness, Hives, Jaundice, New Lesions, Non-Healing Wounds, Rash and Ulcer. HEENT Present- Sore Throat. Not Present- Earache, Hearing Loss, Hoarseness, Nose Bleed, Oral Ulcers, Ringing in the Ears, Seasonal Allergies, Sinus Pain, Visual Disturbances, Wears glasses/contact lenses and Yellow Eyes. Respiratory Not Present- Bloody sputum, Chronic Cough, Difficulty Breathing, Snoring and Wheezing. Breast Not Present- Breast Mass, Breast Pain, Nipple Discharge and Skin Changes. Cardiovascular Not Present- Chest Pain, Difficulty Breathing Lying Down, Leg Cramps, Palpitations, Rapid Heart Rate, Shortness of Breath and Swelling of Extremities. Gastrointestinal Not Present- Abdominal Pain, Bloating, Bloody Stool, Change in Bowel Habits, Chronic diarrhea, Constipation, Difficulty Swallowing, Excessive gas, Gets full quickly at meals, Hemorrhoids, Indigestion, Nausea, Rectal Pain and Vomiting. Female Genitourinary Not Present- Frequency, Nocturia, Painful Urination, Pelvic Pain and Urgency. Musculoskeletal Not Present- Back Pain, Joint Pain, Joint Stiffness, Muscle Pain, Muscle Weakness and Swelling of Extremities. Neurological Not Present- Decreased Memory, Fainting, Headaches, Numbness, Seizures, Tingling, Tremor, Trouble walking and Weakness. Psychiatric Not Present- Anxiety, Bipolar, Change in Sleep Pattern, Depression, Fearful and Frequent crying. Endocrine Not Present- Cold Intolerance, Excessive Hunger, Hair Changes, Heat Intolerance, Hot flashes and New Diabetes. Hematology Not Present- Blood Thinners, Easy Bruising, Excessive bleeding, Gland problems, HIV and Persistent Infections. All other systems negative  Vitals (Tanisha A. Brown RMA; 12/30/2019 1:55 PM) 12/30/2019 1:55 PM Weight: 275  lb Height: 70.5in Body Surface Area: 2.4 m Body Mass Index: 38.9 kg/m  Temp.: 98.66F  Pulse: 80 (Regular)  BP: 124/82 (Sitting, Left Arm, Standard)   Physical Exam (Aneeka Bowden A. Amonte Brookover MD; 12/30/2019 2:18 PM) The physical exam findings are as follows: Note:Gen: alert and well appearing Eye: extraocular motion intact, no scleral icterus Chest: unlabored respirations, symmetrical air entry CV: regular rate and rhythm Abdomen: soft, minimally tender in the right upper quadrant, nondistended. No mass or organomegaly MSK: strength symmetrical throughout, no deformity Neuro: grossly intact, normal gait Psych: normal mood and affect, appropriate insight Skin: warm and dry, no rash or lesion on limited exam    Assessment & Plan (Zaiyden Strozier A. Clarise Chacko MD; 12/30/2019 2:18 PM) CHOLECYSTITIS (K81.9) Story: I recommend proceeding with laparoscopic or robotic cholecystectomy with possible cholangiogram. Discussed risks of surgery including bleeding, pain, scarring, intraabdominal injury specifically to the common bile duct and sequelae, bile leak, conversion to open surgery, failure to resolve symptoms, blood clots/ pulmonary embolus, heart attack, pneumonia, stroke, death. Questions welcomed and answered to patient's satisfaction. She wishes to proceed with surgery.  

## 2020-01-05 NOTE — H&P (View-Only) (Signed)
Stephanie Wilkinson DOB: 1991/01/31 Married / Language: English / Race: White Female   History of Present Illness (Chibuike Fleek A. Fredricka Bonine MD; 12/30/2019 2:17 PM) Patient words: Very pleasant and otherwise healthy 29 year old woman with cholecystitis. She had an episode of severe right-sided abdominal pain in November which contracted her to go to the emergency department. This was in [place]. Apparently a bedside ultrasound was done and they were not able to visualize stones but noted distention and thickening of the gallbladder. She was then seen by Dr. Randa Evens who has completed a formal right upper quadrant ultrasound which describes a 6 mm calculus originating in the cystic duct. She has since been on a low-fat diet and her symptoms are somewhat well controlled. A lot of things will give her pain and she does have waves of nausea. She has lost about 15 pounds throughout this course. She has not had any prior abdominal surgery.   Past Surgical History (Tanisha A. Manson Passey, RMA; 12/30/2019 1:54 PM) No pertinent past surgical history   Diagnostic Studies History (Tanisha A. Manson Passey, RMA; 12/30/2019 1:54 PM) Colonoscopy  never Mammogram  never Pap Smear  1-5 years ago  Allergies (Tanisha A. Manson Passey, RMA; 12/30/2019 1:54 PM) No Known Drug Allergies [12/30/2019]: Allergies Reconciled   Medication History (Tanisha A. Manson Passey, RMA; 12/30/2019 1:54 PM) Pepcid (20MG  Tablet, Oral) Active. Medications Reconciled  Social History (Tanisha A. , RMA; 12/30/2019 1:54 PM) Alcohol use  Occasional alcohol use. Caffeine use  Coffee. No drug use  Tobacco use  Never smoker.  Family History (Tanisha A. 02/27/2020, RMA; 12/30/2019 1:54 PM) Hypertension  Brother, Father, Mother.  Pregnancy / Birth History (Tanisha A. 02/27/2020, RMA; 12/30/2019 1:54 PM) Age at menarche  11 years. Contraceptive History  Oral contraceptives. Gravida  1 Length (months) of breastfeeding  3-6 Maternal age  14-30 Para  1 Regular periods      Review of Systems (Magdala Brahmbhatt A. 38-31 MD; 12/30/2019 2:17 PM) General Present- Appetite Loss, Fatigue and Weight Loss. Not Present- Chills, Fever, Night Sweats and Weight Gain. Skin Not Present- Change in Wart/Mole, Dryness, Hives, Jaundice, New Lesions, Non-Healing Wounds, Rash and Ulcer. HEENT Present- Sore Throat. Not Present- Earache, Hearing Loss, Hoarseness, Nose Bleed, Oral Ulcers, Ringing in the Ears, Seasonal Allergies, Sinus Pain, Visual Disturbances, Wears glasses/contact lenses and Yellow Eyes. Respiratory Not Present- Bloody sputum, Chronic Cough, Difficulty Breathing, Snoring and Wheezing. Breast Not Present- Breast Mass, Breast Pain, Nipple Discharge and Skin Changes. Cardiovascular Not Present- Chest Pain, Difficulty Breathing Lying Down, Leg Cramps, Palpitations, Rapid Heart Rate, Shortness of Breath and Swelling of Extremities. Gastrointestinal Not Present- Abdominal Pain, Bloating, Bloody Stool, Change in Bowel Habits, Chronic diarrhea, Constipation, Difficulty Swallowing, Excessive gas, Gets full quickly at meals, Hemorrhoids, Indigestion, Nausea, Rectal Pain and Vomiting. Female Genitourinary Not Present- Frequency, Nocturia, Painful Urination, Pelvic Pain and Urgency. Musculoskeletal Not Present- Back Pain, Joint Pain, Joint Stiffness, Muscle Pain, Muscle Weakness and Swelling of Extremities. Neurological Not Present- Decreased Memory, Fainting, Headaches, Numbness, Seizures, Tingling, Tremor, Trouble walking and Weakness. Psychiatric Not Present- Anxiety, Bipolar, Change in Sleep Pattern, Depression, Fearful and Frequent crying. Endocrine Not Present- Cold Intolerance, Excessive Hunger, Hair Changes, Heat Intolerance, Hot flashes and New Diabetes. Hematology Not Present- Blood Thinners, Easy Bruising, Excessive bleeding, Gland problems, HIV and Persistent Infections. All other systems negative  Vitals (Tanisha A. Brown RMA; 12/30/2019 1:55 PM) 12/30/2019 1:55 PM Weight: 275  lb Height: 70.5in Body Surface Area: 2.4 m Body Mass Index: 38.9 kg/m  Temp.: 98.66F  Pulse: 80 (Regular)  BP: 124/82 (Sitting, Left Arm, Standard)   Physical Exam (Jezel Basto A. Kae Heller MD; 12/30/2019 2:18 PM) The physical exam findings are as follows: Note:Gen: alert and well appearing Eye: extraocular motion intact, no scleral icterus Chest: unlabored respirations, symmetrical air entry CV: regular rate and rhythm Abdomen: soft, minimally tender in the right upper quadrant, nondistended. No mass or organomegaly MSK: strength symmetrical throughout, no deformity Neuro: grossly intact, normal gait Psych: normal mood and affect, appropriate insight Skin: warm and dry, no rash or lesion on limited exam    Assessment & Plan (Lani Havlik A. Kae Heller MD; 12/30/2019 2:18 PM) CHOLECYSTITIS (K81.9) Story: I recommend proceeding with laparoscopic or robotic cholecystectomy with possible cholangiogram. Discussed risks of surgery including bleeding, pain, scarring, intraabdominal injury specifically to the common bile duct and sequelae, bile leak, conversion to open surgery, failure to resolve symptoms, blood clots/ pulmonary embolus, heart attack, pneumonia, stroke, death. Questions welcomed and answered to patient's satisfaction. She wishes to proceed with surgery.

## 2020-01-06 NOTE — Patient Instructions (Signed)
DUE TO COVID-19 ONLY ONE VISITOR IS ALLOWED TO COME WITH YOU AND STAY IN THE WAITING ROOM ONLY DURING PRE OP AND PROCEDURE DAY OF SURGERY. THE 1 VISITOR MAY VISIT WITH YOU AFTER SURGERY IN YOUR PRIVATE ROOM DURING VISITING HOURS ONLY!  YOU NEED TO HAVE A COVID 19 TEST ON:01/08/2020 @  2:05 PM, THIS TEST MUST BE DONE BEFORE SURGERY, COME  [place], [place] , [address].  ([place]) ONCE YOUR COVID TEST IS COMPLETED, PLEASE BEGIN THE QUARANTINE INSTRUCTIONS AS OUTLINED IN YOUR HANDOUT.                Stephanie Wilkinson      Your procedure is scheduled on: 01/12/2020     Report to [facility] Main  Entrance    Report to admitting at: 6:45  AM     Call this number if you have problems the morning of surgery [phone]    Remember:     Do not eat food or drink liquids :After Midnight.    BRUSH YOUR TEETH MORNING OF SURGERY AND RINSE YOUR MOUTH OUT, NO CHEWING GUM CANDY OR MINTS.     Take these medicines the morning of surgery with A SIP OF WATER: FAMOTIDINE                                 You may not have any metal on your body including hair pins and              piercings  Do not wear jewelry, make-up, lotions, powders or perfumes, deodorant             Do not wear nail polish on your fingernails.  Do not shave  48 hours prior to surgery.                 Do not bring valuables to the hospital. [place].  Contacts, dentures or bridgework may not be worn into surgery.  Leave suitcase in the car. After surgery it may be brought to your room.     Patients discharged the day of surgery will not be allowed to drive home. IF YOU ARE HAVING SURGERY AND GOING HOME THE SAME DAY, YOU MUST HAVE AN ADULT TO DRIVE YOU HOME AND BE WITH YOU FOR 24 HOURS. YOU MAY GO HOME BY TAXI OR UBER OR ORTHERWISE, BUT AN ADULT MUST ACCOMPANY YOU HOME AND STAY WITH YOU FOR 24 HOURS.  Name and phone number of your  driver:  Special Instructions: N/A              Please read over the following fact sheets you were given: _____________________________________________________________________             [facility] - Preparing for Surgery Before surgery, you can play an important role.  Because skin is not sterile, your skin needs to be as free of germs as possible.  You can reduce the number of germs on your skin by washing with CHG (chlorahexidine gluconate) soap before surgery.  CHG is an antiseptic cleaner which kills germs and bonds with the skin to continue killing germs even after washing. Please DO NOT use if you have an allergy to CHG or antibacterial soaps.  If your skin becomes reddened/irritated stop using the CHG and  inform your nurse when you arrive at Short Stay. Do not shave (including legs and underarms) for at least 48 hours prior to the first CHG shower.  You may shave your face/neck. Please follow these instructions carefully:  1.  Shower with CHG Soap the night before surgery and the  morning of Surgery.  2.  If you choose to wash your hair, wash your hair first as usual with your  normal  shampoo.  3.  After you shampoo, rinse your hair and body thoroughly to remove the  shampoo.                           4.  Use CHG as you would any other liquid soap.  You can apply chg directly  to the skin and wash                       Gently with a scrungie or clean washcloth.  5.  Apply the CHG Soap to your body ONLY FROM THE NECK DOWN.   Do not use on face/ open                           Wound or open sores. Avoid contact with eyes, ears mouth and genitals (private parts).                       Wash face,  Genitals (private parts) with your normal soap.             6.  Wash thoroughly, paying special attention to the area where your surgery  will be performed.  7.  Thoroughly rinse your body with warm water from the neck down.  8.  DO NOT shower/wash with your normal soap after using and rinsing  off  the CHG Soap.                9.  Pat yourself dry with a clean towel.            10.  Wear clean pajamas.            11.  Place clean sheets on your bed the night of your first shower and do not  sleep with pets. Day of Surgery : Do not apply any lotions/deodorants the morning of surgery.  Please wear clean clothes to the hospital/surgery center.  FAILURE TO FOLLOW THESE INSTRUCTIONS MAY RESULT IN THE CANCELLATION OF YOUR SURGERY PATIENT SIGNATURE_________________________________  NURSE SIGNATURE__________________________________  ________________________________________________________________________

## 2020-01-07 ENCOUNTER — Encounter (HOSPITAL_COMMUNITY)
Admission: RE | Admit: 2020-01-07 | Discharge: 2020-01-07 | Disposition: A | Discharge status: Home / Self Care | Payer: Managed Care, Other (non HMO) | Source: Ambulatory Visit | Attending: Surgery | Admitting: Surgery

## 2020-01-07 ENCOUNTER — Encounter (HOSPITAL_COMMUNITY): Payer: Self-pay

## 2020-01-07 ENCOUNTER — Other Ambulatory Visit: Payer: Self-pay

## 2020-01-07 DIAGNOSIS — Z01818 Encounter for other preprocedural examination: Secondary | ICD-10-CM | POA: Insufficient documentation

## 2020-01-07 LAB — CBC WITH DIFFERENTIAL/PLATELET
Abs Immature Granulocytes: 0.02 10*3/uL (ref 0.00–0.07)
Basophils Absolute: 0.1 10*3/uL (ref 0.0–0.1)
Basophils Relative: 1 %
Eosinophils Absolute: 0.2 10*3/uL (ref 0.0–0.5)
Eosinophils Relative: 3 %
HCT: 40.3 % (ref 36.0–46.0)
Hemoglobin: 12.7 g/dL (ref 12.0–15.0)
Immature Granulocytes: 0 %
Lymphocytes Relative: 31 %
Lymphs Abs: 2.1 10*3/uL (ref 0.7–4.0)
MCH: 27.9 pg (ref 26.0–34.0)
MCHC: 31.5 g/dL (ref 30.0–36.0)
MCV: 88.4 fL (ref 80.0–100.0)
Monocytes Absolute: 0.5 10*3/uL (ref 0.1–1.0)
Monocytes Relative: 7 %
Neutro Abs: 3.9 10*3/uL (ref 1.7–7.7)
Neutrophils Relative %: 58 %
Platelets: 253 10*3/uL (ref 150–400)
RBC: 4.56 MIL/uL (ref 3.87–5.11)
RDW: 12.6 % (ref 11.5–15.5)
WBC: 6.7 10*3/uL (ref 4.0–10.5)
nRBC: 0 % (ref 0.0–0.2)

## 2020-01-07 LAB — COMPREHENSIVE METABOLIC PANEL
ALT: 19 U/L (ref 0–44)
AST: 15 U/L (ref 15–41)
Albumin: 4.4 g/dL (ref 3.5–5.0)
Alkaline Phosphatase: 82 U/L (ref 38–126)
Anion gap: 10 (ref 5–15)
BUN: 11 mg/dL (ref 6–20)
CO2: 25 mmol/L (ref 22–32)
Calcium: 9.4 mg/dL (ref 8.9–10.3)
Chloride: 104 mmol/L (ref 98–111)
Creatinine, Ser: 0.74 mg/dL (ref 0.44–1.00)
GFR calc Af Amer: >60 mL/min (ref >60)
GFR calc non Af Amer: >60 mL/min (ref >60)
Glucose, Bld: 93 mg/dL (ref 70–99)
Potassium: 4.5 mmol/L (ref 3.5–5.1)
Sodium: 139 mmol/L (ref 135–145)
Total Bilirubin: 0.6 mg/dL (ref 0.3–1.2)
Total Protein: 7.5 g/dL (ref 6.5–8.1)

## 2020-01-07 NOTE — Progress Notes (Signed)
PCP -  Cardiologist -   Chest x-ray -  EKG - 01/07/2020 Stress Test -  ECHO -  Cardiac Cath -   Sleep Study -  CPAP -   Fasting Blood Sugar -  Checks Blood Sugar _____ times a day  Blood Thinner Instructions: Aspirin Instructions: Last Dose:  Anesthesia review:   Patient denies shortness of breath, fever, cough and chest pain at PAT appointment   Patient verbalized understanding of instructions that were given to them at the PAT appointment. Patient was also instructed that they will need to review over the PAT instructions again at home before surgery.

## 2020-01-08 ENCOUNTER — Ambulatory Visit (HOSPITAL_COMMUNITY): Payer: Managed Care, Other (non HMO) | Admitting: Physician Assistant

## 2020-01-08 ENCOUNTER — Other Ambulatory Visit (HOSPITAL_COMMUNITY)
Admission: RE | Admit: 2020-01-08 | Discharge: 2020-01-08 | Disposition: A | Discharge status: Home / Self Care | Payer: Managed Care, Other (non HMO) | Source: Ambulatory Visit | Attending: Surgery | Admitting: Surgery

## 2020-01-08 DIAGNOSIS — Z20822 Contact with and (suspected) exposure to covid-19: Secondary | ICD-10-CM | POA: Diagnosis not present

## 2020-01-08 DIAGNOSIS — Z01812 Encounter for preprocedural laboratory examination: Secondary | ICD-10-CM | POA: Diagnosis present

## 2020-01-09 LAB — NOVEL CORONAVIRUS, NAA (HOSP ORDER, SEND-OUT TO REF LAB; TAT 18-24 HRS): SARS-CoV-2, NAA: NOT DETECTED

## 2020-01-11 MED ORDER — INDOCYANINE GREEN 25 MG IV SOLR
2.5000 mg | Freq: Once | INTRAVENOUS | Status: DC
  Administered 2020-01-12: 2.5 mg via INTRAVENOUS
  Filled 2020-01-11: qty 2.5

## 2020-01-11 MED ORDER — INDOCYANINE GREEN 25 MG IV SOLR
2.5000 mg | Freq: Once | INTRAVENOUS | Status: DC
  Filled 2020-01-11: qty 25

## 2020-01-11 MED ORDER — DEXTROSE 5 % IV SOLN
3.0000 g | INTRAVENOUS | Status: DC
  Filled 2020-01-11: qty 3000

## 2020-01-11 MED ORDER — BUPIVACAINE LIPOSOME 1.3 % IJ SUSP
20.0000 mL | Freq: Once | INTRAMUSCULAR | Status: DC
  Filled 2020-01-11: qty 20

## 2020-01-11 NOTE — Anesthesia Preprocedure Evaluation (Deleted)
Anesthesia Evaluation  Patient identified by MRN, date of birth, ID band  Reviewed: Allergy & Precautions, NPO status , Patient's Chart, lab work & pertinent test results  Airway Mallampati: II  TM Distance: >3 FB Neck ROM: Full    Dental  (+) Dental Advisory Given   Pulmonary neg pulmonary ROS, former smoker,    Pulmonary exam normal breath sounds clear to auscultation       Cardiovascular hypertension, Pt. on medications Normal cardiovascular exam Rhythm:Regular Rate:Normal     Neuro/Psych negative neurological ROS     GI/Hepatic negative GI ROS, Neg liver ROS,   Endo/Other  Morbid obesity  Renal/GU negative Renal ROS     Musculoskeletal negative musculoskeletal ROS (+)   Abdominal (+) + obese,   Peds  Hematology negative hematology ROS (+)   Anesthesia Other Findings Day of surgery medications reviewed with the patient.  Reproductive/Obstetrics                                                             Anesthesia Evaluation  Patient identified by MRN, date of birth, ID band Patient awake    Reviewed: Allergy & Precautions, Patient's Chart, lab work & pertinent test results  Airway Mallampati: III  TM Distance: >3 FB Neck ROM: Full    Dental no notable dental hx. (+) Teeth Intact   Pulmonary former smoker,    Pulmonary exam normal breath sounds clear to auscultation       Cardiovascular hypertension, Normal cardiovascular exam Rhythm:Regular Rate:Normal     Neuro/Psych negative neurological ROS  negative psych ROS   GI/Hepatic Neg liver ROS, GERD  Medicated and Controlled,  Endo/Other  Morbid obesity  Renal/GU negative Renal ROS  negative genitourinary   Musculoskeletal negative musculoskeletal ROS (+)   Abdominal (+) + obese,   Peds  Hematology  (+) anemia ,   Anesthesia Other Findings   Reproductive/Obstetrics (+) Pregnancy                              Anesthesia Physical Anesthesia Plan  ASA: II  Anesthesia Plan: Epidural   Post-op Pain Management:    Induction:   Airway Management Planned: Natural Airway  Additional Equipment:   Intra-op Plan:   Post-operative Plan:   Informed Consent: I have reviewed the patients History and Physical, chart, labs and discussed the procedure including the risks, benefits and alternatives for the proposed anesthesia with the patient or authorized representative who has indicated his/her understanding and acceptance.     Plan Discussed with: Anesthesiologist  Anesthesia Plan Comments:         Anesthesia Quick Evaluation  Anesthesia Physical Anesthesia Plan  ASA: II  Anesthesia Plan: General   Post-op Pain Management:    Induction: Intravenous  PONV Risk Score and Plan: 4 or greater and Ondansetron, Dexamethasone, Treatment may vary due to age or medical condition, Midazolam and Scopolamine patch - Pre-op  Airway Management Planned: Oral ETT  Additional Equipment: None  Intra-op Plan:   Post-operative Plan: Extubation in OR  Informed Consent: I have reviewed the patients History and Physical, chart, labs and discussed the procedure including the risks, benefits and alternatives for the proposed anesthesia with the patient or authorized representative who has indicated his/her understanding  and acceptance.     Dental advisory given  Plan Discussed with: CRNA  Anesthesia Plan Comments:         Anesthesia Quick Evaluation

## 2020-01-12 ENCOUNTER — Ambulatory Visit (HOSPITAL_COMMUNITY)
Admission: RE | Admit: 2020-01-12 | Discharge: 2020-01-12 | Disposition: A | Discharge status: Home / Self Care | Payer: Managed Care, Other (non HMO) | Attending: Surgery | Admitting: Surgery

## 2020-01-12 ENCOUNTER — Encounter (HOSPITAL_COMMUNITY): Payer: Self-pay | Admitting: Surgery

## 2020-01-12 ENCOUNTER — Other Ambulatory Visit: Payer: Self-pay

## 2020-01-12 ENCOUNTER — Encounter (HOSPITAL_COMMUNITY)
Admission: RE | Disposition: A | Discharge status: Home / Self Care | Payer: Self-pay | Source: Home / Self Care | Attending: Surgery

## 2020-01-12 DIAGNOSIS — Z3201 Encounter for pregnancy test, result positive: Secondary | ICD-10-CM | POA: Insufficient documentation

## 2020-01-12 DIAGNOSIS — Z538 Procedure and treatment not carried out for other reasons: Secondary | ICD-10-CM | POA: Diagnosis present

## 2020-01-12 DIAGNOSIS — K819 Cholecystitis, unspecified: Secondary | ICD-10-CM | POA: Diagnosis not present

## 2020-01-12 LAB — HCG, QUANTITATIVE, PREGNANCY: hCG, Beta Chain, Quant, S: 64 m[IU]/mL — ABNORMAL HIGH (ref <5)

## 2020-01-12 LAB — PREGNANCY, URINE: Preg Test, Ur: POSITIVE — AB

## 2020-01-12 LAB — HCG, SERUM, QUALITATIVE: Preg, Serum: POSITIVE — AB

## 2020-01-12 SURGERY — CHOLECYSTECTOMY, ROBOT-ASSISTED, LAPAROSCOPIC
Anesthesia: General

## 2020-01-12 MED ORDER — GABAPENTIN 300 MG PO CAPS
300.0000 mg | ORAL_CAPSULE | ORAL | Status: AC
  Administered 2020-01-12: 300 mg via ORAL
  Filled 2020-01-12: qty 1

## 2020-01-12 MED ORDER — PROPOFOL 10 MG/ML IV BOLUS
INTRAVENOUS | Status: AC
  Filled 2020-01-12: qty 20

## 2020-01-12 MED ORDER — SCOPOLAMINE 1 MG/3DAYS TD PT72
MEDICATED_PATCH | TRANSDERMAL | Status: AC
  Filled 2020-01-12: qty 1

## 2020-01-12 MED ORDER — CEFAZOLIN SODIUM-DEXTROSE 2-4 GM/100ML-% IV SOLN
INTRAVENOUS | Status: AC
  Filled 2020-01-12: qty 200

## 2020-01-12 MED ORDER — CHLORHEXIDINE GLUCONATE 4 % EX LIQD: 60.0000 mL | Freq: Once | CUTANEOUS | Status: DC

## 2020-01-12 MED ORDER — INDOCYANINE GREEN 25 MG IV SOLR
2.5000 mg | Freq: Once | INTRAVENOUS | Status: DC
  Filled 2020-01-12: qty 25

## 2020-01-12 MED ORDER — TRAMADOL HCL 50 MG PO TABS: 50.0000 mg | ORAL_TABLET | Freq: Four times a day (QID) | ORAL | 0 refills | Status: DC | PRN

## 2020-01-12 MED ORDER — DOCUSATE SODIUM 100 MG PO CAPS: 100.0000 mg | ORAL_CAPSULE | Freq: Two times a day (BID) | ORAL | 0 refills | Status: AC

## 2020-01-12 MED ORDER — FENTANYL CITRATE (PF) 250 MCG/5ML IJ SOLN
INTRAMUSCULAR | Status: AC
  Filled 2020-01-12: qty 5

## 2020-01-12 MED ORDER — ACETAMINOPHEN 500 MG PO TABS
1000.0000 mg | ORAL_TABLET | ORAL | Status: AC
  Administered 2020-01-12: 1000 mg via ORAL
  Filled 2020-01-12: qty 2

## 2020-01-12 MED ORDER — MIDAZOLAM HCL 2 MG/2ML IJ SOLN
INTRAMUSCULAR | Status: AC
  Filled 2020-01-12: qty 2

## 2020-01-12 MED ORDER — BUPIVACAINE HCL (PF) 0.25 % IJ SOLN
INTRAMUSCULAR | Status: AC
  Filled 2020-01-12: qty 30

## 2020-01-12 MED ORDER — LACTATED RINGERS IV SOLN: INTRAVENOUS | Status: DC

## 2020-01-12 NOTE — Progress Notes (Addendum)
Pt urine pregnancy test is positive. She states she has been having menstrual bleeding for 3 weeks and there is minimal possibility she is pregnant. Will obtain stat quantitative serum pregnancy test prior to proceeding.   Addendum 8648- serum qual/quant positive. Patient will contact her OBGYN. Will cancel today's case. If persistent biliary symptoms will tentatively plan to reschedule surgery in second trimester.

## 2020-01-12 NOTE — Interval H&P Note (Signed)
History and Physical Interval Note:  01/12/2020 8:08 AM  Stephanie Wilkinson  has presented today for surgery, with the diagnosis of CHOLECYSTITIS.  The various methods of treatment have been discussed with the patient and family. After consideration of risks, benefits and other options for treatment, the patient has consented to  Procedure(s): XI ROBOTIC ASSISTED LAPAROSCOPIC CHOLECYSTECTOMY (N/A) as a surgical intervention.  The patient's history has been reviewed, patient examined, no change in status, stable for surgery.  I have reviewed the patient's chart and labs.  Questions were answered to the patient's satisfaction.     Meade Hogeland Lollie Sails

## 2020-01-12 NOTE — Discharge Instructions (Signed)
LAPAROSCOPIC SURGERY: POST OP INSTRUCTIONS  ######################################################################  EAT Gradually transition to a high fiber diet with a fiber supplement over the next few weeks after discharge.  Start with a pureed / full liquid diet (see below)  WALK Walk an hour a day.  Control your pain to do that.    CONTROL PAIN Control pain so that you can walk, sleep, tolerate sneezing/coughing, go up/down stairs.  HAVE A BOWEL MOVEMENT DAILY Keep your bowels regular to avoid problems.  OK to try a laxative to override constipation.  OK to use an antidairrheal to slow down diarrhea.  Call if not better after 2 tries  CALL IF YOU HAVE PROBLEMS/CONCERNS Call if you are still struggling despite following these instructions. Call if you have concerns not answered by these instructions  ######################################################################    1. DIET: Follow a light bland diet & liquids the first 24 hours after arrival home, such as soup, liquids, starches, etc.  Be sure to drink plenty of fluids.  Quickly advance to a usual solid diet within a few days.  Avoid fast food or heavy meals as your are more likely to get nauseated or have irregular bowels.  A low-sugar, high-fiber diet for the rest of your life is ideal.  2. Take your usually prescribed home medications unless otherwise directed.  3. PAIN CONTROL: a. Pain is best controlled by a usual combination of three different methods TOGETHER: i. Ice/Heat ii. Over the counter pain medication iii. Prescription pain medication b. Most patients will experience some swelling and bruising around the incisions.  Ice packs or heating pads (30-60 minutes up to 6 times a day) will help. Use ice for the first few days to help decrease swelling and bruising, then switch to heat to help relax tight/sore spots and speed recovery.  Some people prefer to use ice alone, heat alone, alternating between ice & heat.   Experiment to what works for you.  Swelling and bruising can take several weeks to resolve.   c. It is helpful to take an over-the-counter pain medication regularly for the first few days: i. Naproxen (Aleve, etc)  Two 220mg tabs twice a day OR Ibuprofen (Advil, etc) Three 200mg tabs four times a day (every meal & bedtime) AND ii. Acetaminophen (Tylenol, etc) 500-650mg four times a day (every meal & bedtime) d. A  prescription for pain medication (such as oxycodone, hydrocodone, tramadol, gabapentin, methocarbamol, etc) should be given to you upon discharge.  Take your pain medication as prescribed.  i. If you are having problems/concerns with the prescription medicine (does not control pain, nausea, vomiting, rash, itching, etc), please call us [phone] to see if we need to switch you to a different pain medicine that will work better for you and/or control your side effect better. ii. If you need a refill on your pain medication, please give us 48 hour notice.  contact your pharmacy.  They will contact our office to request authorization. Prescriptions will not be filled after 5 pm or on week-ends  4. Avoid getting constipated.   a. Between the surgery and the pain medications, it is common to experience some constipation.   b. Increasing fluid intake and taking a fiber supplement (such as Metamucil, Citrucel, FiberCon, MiraLax, etc) 1-2 times a day regularly will usually help prevent this problem from occurring.   c. A mild laxative (prune juice, Milk of Magnesia, MiraLax, etc) should be taken according to package directions if there are no bowel movements after 48   hours.   5. Watch out for diarrhea.   a. If you have many loose bowel movements, simplify your diet to bland foods & liquids for a few days.   b. Stop any stool softeners and decrease your fiber supplement.   c. Switching to mild anti-diarrheal medications (Kayopectate, Pepto Bismol) can help.   d. If this worsens or does not  improve, please call us.  6. Wash / shower every day.  You may shower over the skin glue which is waterproof.  Continue to shower over incision(s) after the dressing is off.  7. Glue will flake off after about 2 weeks.  You may leave the incision open to air.  You may replace a dressing/Band-Aid to cover the incision for comfort if you wish.   8. ACTIVITIES as tolerated:   a. You may resume regular (light) daily activities beginning the next day--such as daily self-care, walking, climbing stairs--gradually increasing activities as tolerated.  If you can walk 30 minutes without difficulty, it is safe to try more intense activity such as jogging, treadmill, bicycling, low-impact aerobics, swimming, etc. b. Save the most intensive and strenuous activity for last such as sit-ups, heavy lifting, contact sports, etc  Refrain from any heavy lifting or straining until you are off narcotics for pain control.   c. DO NOT PUSH THROUGH PAIN.  Let pain be your guide: If it hurts to do something, don't do it.  Pain is your body warning you to avoid that activity for another week until the pain goes down. d. You may drive when you are no longer taking prescription pain medication, you can comfortably wear a seatbelt, and you can safely maneuver your car and apply brakes. e. You may have sexual intercourse when it is comfortable.  9. FOLLOW UP in our office a. Please call [facility] at [phone] to set up an appointment to see your surgeon in the office for a follow-up appointment approximately 2-3 weeks after your surgery. b. Make sure that you call for this appointment the day you arrive home to insure a convenient appointment time.  10. IF YOU HAVE DISABILITY OR FAMILY LEAVE FORMS, BRING THEM TO THE OFFICE FOR PROCESSING.  DO NOT GIVE THEM TO YOUR DOCTOR.   WHEN TO CALL US [phone]: 1. Poor pain control 2. Reactions / problems with new medications (rash/itching, nausea, etc)  3. Fever over 101.5 F  (38.5 C) 4. Inability to urinate 5. Nausea and/or vomiting 6. Worsening swelling or bruising 7. Continued bleeding from incision. 8. Increased pain, redness, or drainage from the incision   The clinic staff is available to answer your questions during regular business hours (8:30am-5pm).  Please don't hesitate to call and ask to speak to one of our nurses for clinical concerns.   If you have a medical emergency, go to the nearest emergency room or call 911.  A surgeon from [facility] is always on call at the hospitals   [facility] [address], [address], [place], [place]  [address] ? MAIN: [phone] ? TOLL FREE: [phone] ?  FAX [phone] [email or web address]   

## 2020-01-21 ENCOUNTER — Inpatient Hospital Stay (HOSPITAL_COMMUNITY)
Admission: EM | Admit: 2020-01-21 | Discharge: 2020-01-21 | Disposition: A | Discharge status: Home / Self Care | Payer: Managed Care, Other (non HMO) | Attending: Obstetrics and Gynecology | Admitting: Obstetrics and Gynecology

## 2020-01-21 ENCOUNTER — Other Ambulatory Visit: Payer: Self-pay

## 2020-01-21 ENCOUNTER — Inpatient Hospital Stay (HOSPITAL_COMMUNITY): Payer: Managed Care, Other (non HMO)

## 2020-01-21 ENCOUNTER — Encounter (HOSPITAL_COMMUNITY): Payer: Self-pay | Admitting: Obstetrics and Gynecology

## 2020-01-21 DIAGNOSIS — R109 Unspecified abdominal pain: Secondary | ICD-10-CM | POA: Diagnosis present

## 2020-01-21 DIAGNOSIS — O009 Unspecified ectopic pregnancy without intrauterine pregnancy: Secondary | ICD-10-CM | POA: Insufficient documentation

## 2020-01-21 DIAGNOSIS — O0281 Inappropriate change in quantitative human chorionic gonadotropin (hCG) in early pregnancy: Secondary | ICD-10-CM | POA: Insufficient documentation

## 2020-01-21 DIAGNOSIS — Z87891 Personal history of nicotine dependence: Secondary | ICD-10-CM | POA: Diagnosis not present

## 2020-01-21 DIAGNOSIS — O209 Hemorrhage in early pregnancy, unspecified: Secondary | ICD-10-CM

## 2020-01-21 LAB — CBC
HCT: 39.8 % (ref 36.0–46.0)
Hemoglobin: 13.2 g/dL (ref 12.0–15.0)
MCH: 28.5 pg (ref 26.0–34.0)
MCHC: 33.2 g/dL (ref 30.0–36.0)
MCV: 86 fL (ref 80.0–100.0)
Platelets: 262 10*3/uL (ref 150–400)
RBC: 4.63 MIL/uL (ref 3.87–5.11)
RDW: 12.5 % (ref 11.5–15.5)
WBC: 6.3 10*3/uL (ref 4.0–10.5)
nRBC: 0 % (ref 0.0–0.2)

## 2020-01-21 LAB — WET PREP, GENITAL
Clue Cells Wet Prep HPF POC: NONE SEEN
Sperm: NONE SEEN
Trich, Wet Prep: NONE SEEN
Yeast Wet Prep HPF POC: NONE SEEN

## 2020-01-21 LAB — COMPREHENSIVE METABOLIC PANEL
ALT: 17 U/L (ref 0–44)
AST: 16 U/L (ref 15–41)
Albumin: 3.8 g/dL (ref 3.5–5.0)
Alkaline Phosphatase: 74 U/L (ref 38–126)
Anion gap: 9 (ref 5–15)
BUN: 5 mg/dL — ABNORMAL LOW (ref 6–20)
CO2: 24 mmol/L (ref 22–32)
Calcium: 8.9 mg/dL (ref 8.9–10.3)
Chloride: 105 mmol/L (ref 98–111)
Creatinine, Ser: 0.78 mg/dL (ref 0.44–1.00)
GFR calc Af Amer: >60 mL/min (ref >60)
GFR calc non Af Amer: >60 mL/min (ref >60)
Glucose, Bld: 96 mg/dL (ref 70–99)
Potassium: 4 mmol/L (ref 3.5–5.1)
Sodium: 138 mmol/L (ref 135–145)
Total Bilirubin: 0.9 mg/dL (ref 0.3–1.2)
Total Protein: 6.7 g/dL (ref 6.5–8.1)

## 2020-01-21 LAB — URINALYSIS, ROUTINE W REFLEX MICROSCOPIC
Bilirubin Urine: NEGATIVE
Glucose, UA: NEGATIVE mg/dL
Ketones, ur: NEGATIVE mg/dL
Leukocytes,Ua: NEGATIVE
Nitrite: NEGATIVE
Protein, ur: NEGATIVE mg/dL
Specific Gravity, Urine: 1.017 (ref 1.005–1.030)
pH: 6 (ref 5.0–8.0)

## 2020-01-21 LAB — HCG, QUANTITATIVE, PREGNANCY: hCG, Beta Chain, Quant, S: 98 m[IU]/mL — ABNORMAL HIGH (ref <5)

## 2020-01-21 MED ORDER — METHOTREXATE FOR ECTOPIC PREGNANCY
50.0000 mg/m2 | Freq: Once | INTRAMUSCULAR | Status: AC
  Administered 2020-01-21: 125 mg via INTRAMUSCULAR
  Filled 2020-01-21: qty 1

## 2020-01-21 MED ORDER — ONDANSETRON HCL 4 MG PO TABS: 4.0000 mg | ORAL_TABLET | Freq: Three times a day (TID) | ORAL | 0 refills | Status: DC | PRN

## 2020-01-21 NOTE — MAU Note (Signed)
Been having some pretty heavy cramping, started last night. Also having pain in RLQ- started last night, feels like she has done an ab workout, but only hurts on the one side. Has been bleeding end of Dec, stopped first wk of Jan, off 5 days started again, cont to be off and on. Has been getting blood work, checking hcg level (56,78, back down to 50's).  Has not had an [place].  Having intermittent, pelvic/pain and pressure.

## 2020-01-21 NOTE — Discharge Instructions (Signed)
Methotrexate Treatment for an Ectopic Pregnancy, Care After This sheet gives you information about how to care for yourself after your procedure. Your health care provider may also give you more specific instructions. If you have problems or questions, contact your health care provider. What can I expect after the procedure? After the procedure, it is common to have:  Abdominal cramping.  Vaginal bleeding.  Fatigue.  Nausea.  Vomiting.  Diarrhea. Blood tests will be taken at timed intervals for several days or weeks to check your pregnancy hormone levels. The blood tests will be done until the pregnancy hormone can no longer be detected in the blood. Follow these instructions at home: Activity  Do not have sex until your health care provider approves.  Limit activities that take a lot of effort as told by your health care provider. Medicines  Take over the counter and prescription medicines only as told by your health care provider.  Do not take aspirin, ibuprofen, naproxen, or any other NSAIDs.  Do not take folic acid, prenatal vitamins, or other vitamins that contain folic acid. General instructions   Do not drink alcohol.  Follow instructions from your health care provider on how and when to report any symptoms that may indicate a ruptured ectopic pregnancy.  Keep all follow-up visits as told by your health care provider. This is important. Contact a health care provider if:  You have persistent nausea and vomiting.  You have persistent diarrhea.  You are having a reaction to the medicine, such as: ? Tiredness. ? Skin rash. ? Hair loss. Get help right away if:  Your abdominal or pelvic pain gets worse.  You have more vaginal bleeding.  You feel light-headed or you faint.  You have shortness of breath.  Your heart rate increases.  You develop a cough.  You have chills.  You have a fever. Summary  After the procedure, it is common to have symptoms  of abdominal cramping, vaginal bleeding and fatigue. You may also experience other symptoms.  Blood tests will be taken at timed intervals for several days or weeks to check your pregnancy hormone levels. The blood tests will be done until the pregnancy hormone can no longer be detected in the blood.  Limit strenuous activity as told by your health care provider.  Follow instructions from your health care provider on how and when to report any symptoms that may indicate a ruptured ectopic pregnancy. This information is not intended to replace advice given to you by your health care provider. Make sure you discuss any questions you have with your health care provider. Document Revised: 11/22/2017 Document Reviewed: 01/29/2017 Elsevier Patient Education  2020 [employer]   Ectopic Pregnancy  An ectopic pregnancy happens when a fertilized egg grows outside the womb (uterus). The fertilized egg cannot stay alive outside of the womb. This problem often happens in a fallopian tube. It is often caused by damage to the tube. If this problem is found early, you may be treated with medicine that stops the egg from growing. If your tube tears or bursts open (ruptures), you will bleed inside. Often, there is very bad pain in the lower belly. This is an emergency. You will need surgery. Get help right away. Follow these instructions at home: After being treated with medicine or surgery:  Rest and limit your activity for as long as told by your doctor.  Until your doctor says that it is safe: ? Do not lift anything that is heavier than   10 lb (4.5 kg) or the limit that your doctor tells you. ? Avoid exercise and any movement that takes a lot of effort.  To prevent problems when pooping (constipation): ? Eat a healthy diet. This includes:  Fruits.  Vegetables.  Whole grains. ? Drink 6-8 glasses of water a day. Contact a doctor if: Get help right away if:  You have sudden and very bad pain in  your belly.  You have very bad pain in your shoulders or neck.  You have pain that gets worse and is not helped by medicine.  You have: ? A fever or chills. ? Vaginal bleeding. ? Redness or swelling at the site of a surgical cut (incision).  You feel sick to your stomach (nauseous) or you throw up (vomit).  You feel dizzy or weak.  You feel light-headed or you pass out (faint). Summary  An ectopic pregnancy happens when a fertilized egg grows outside the womb (uterus).  If this problem is found early, you may be treated with medicine that stops the egg from growing.  If your tube tears or bursts open (ruptures), you will need surgery. This is an emergency. Get help right away. This information is not intended to replace advice given to you by your health care provider. Make sure you discuss any questions you have with your health care provider. Document Revised: 11/22/2017 Document Reviewed: 01/03/2017 Elsevier Patient Education  2020 [employer] Inc.   Ruptured Ectopic Pregnancy  An ectopic pregnancy is when a fertilized egg attaches (implants) outside of the uterus, usually in a fallopian tube. A ruptured ectopic pregnancy is when the fallopian tube tears or bursts. This results in internal bleeding, intense abdominal pain, and sometimes, vaginal bleeding. Most ectopic pregnancies occur in the fallopian tube. In rare cases, it may occur on the ovary, intestine, pelvis, or cervix. An ectopic pregnancy does not have the ability to develop into a normal, healthy baby. A ruptured ectopic pregnancy can affect your ability to have children (fertility), depending on damage it causes to your reproductive organs. Ruptured ectopic pregnancy is a medical emergency. If not treated immediately, it can lead to blood loss, shock, or even death. What are the causes? Most ectopic pregnancies are caused by damage to the fallopian tubes. The damage prevents the fertilized egg from implanting in the  uterus. In some cases, the cause may not be known. What increases the risk? You are at increased risk for an ectopic pregnancy if:  You have had a previous ectopic pregnancy.  You have had previous fallopian tube surgery.  You have had previous surgery to have the fallopian tubes tied (tubal ligation).  You have had infertility treatments or have a history of infertility.  You have been exposed to DES. DES is a medicine that was used until 1971 and had effects on babies whose mothers took the medicine.  You use an IUD (intrauterine device) for birth control.  You use progestin-only oral contraception for birth control.  You have a history of pelvic inflammatory disease (PID).  You have a history of endometriosis.  You smoke.  You became sexually active before 29 years of age.  You have multiple sexual partners. What are the signs or symptoms? Symptoms of a ruptured ectopic pregnancy and internal bleeding may include:  Sudden, severe pain in the abdomen and pelvis.  Dizziness or fainting.  Pain in the shoulder area.  Vaginal bleeding. How is this diagnosed? This condition is diagnosed based on your medical history, symptoms, a  physical exam, and tests, which may include:  A pregnancy test.  An ultrasound.  Measuring the levels of the pregnancy hormone in the bloodstream.  Taking a sample of tissue from the uterus (dilation and curettage, D&C).  Surgery to visually examine the inside of the abdomen using a lighted tube (laparoscopy). How is this treated? This condition is treated with IV fluids and emergency surgery to remove the ectopic pregnancy and repair the area where the rupture occured. If you have lost a lot of blood, you may need a blood transfusion. If you are Rh negative and your baby's father is Rh positive, or the Rh type of the father is unknown, you may receive a Rho (D) immune globulin shot. This is to prevent Rh problems in future  pregnancies. Additional medicines may be given. Get help right away if:  You are taking medicines to treat an ectopic pregnancy and you develop symptoms of a rupture. These include: ? Fever or chills. ? Shoulder pain. ? Vaginal bleeding. ? Nausea and vomiting. ? Severe abdominal pain or cramping. ? Feeling light-headed or fainting. Summary  An ectopic pregnancy is when a fertilized egg attaches (implants) outside of the uterus, usually in a fallopian tube. A ruptured ectopic pregnancy is when the fallopian tube tears or bursts.  Ruptured ectopic pregnancy is a medical emergency. If not treated immediately, it can lead to blood loss, shock, or even death.  This condition is treated with IV fluids and emergency surgery to remove the ectopic pregnancy and repair the area where the rupture occured. If you have lost a lot of blood, you may need a blood transfusion. This information is not intended to replace advice given to you by your health care provider. Make sure you discuss any questions you have with your health care provider. Document Revised: 11/22/2017 Document Reviewed: 02/27/2017 Elsevier Patient Education  2020 [employer].

## 2020-01-21 NOTE — MAU Provider Note (Signed)
Chief Complaint: Abdominal Pain and Vaginal Bleeding   First Provider Initiated Contact with Patient 01/21/20 1252      SUBJECTIVE HPI: Stephanie Wilkinson is a 29 y.o. G2P1001 who presents to maternity admissions with abdominal cramping and vaginal bleeding.  She initially presented on 01/12/20 for gallbladder surgery and had a positive urine pregnancy test and hcg level of 64. She reports intermittent vaginal spotting since December. Repeat quant hcg in her OB/Gyn office at [facility] was 56 1/19, then 78, then int he 50s per the pt.  She started having heavy cramping last night with no change in the light bleeding.  The pain is low in her abdomen, more on the right than left, sharp and cramping pain that is unchanged in intensity since onset. It does not radiate. She has not tried any treatments. There are no other symptoms.     HPI  Past Medical History:  Diagnosis Date  . Hypertension   . Medical history non-contributory    Past Surgical History:  Procedure Laterality Date  . NO PAST SURGERIES     Social History   Socioeconomic History  . Marital status: Married    Spouse name: Not on file  . Number of children: Not on file  . Years of education: Not on file  . Highest education level: Not on file  Occupational History  . Not on file  Tobacco Use  . Smoking status: Former Smoker    Quit date: 08/24/2012    Years since quitting: 7.4  . Smokeless tobacco: Never Used  Substance and Sexual Activity  . Alcohol use: Yes  . Drug use: No  . Sexual activity: Yes  Other Topics Concern  . Not on file  Social History Narrative  . Not on file   Social Determinants of Health   Financial Resource Strain:   . Difficulty of Paying Living Expenses: Not on file  Food Insecurity:   . Worried About Charity fundraiser in the Last Year: Not on file  . Ran Out of Food in the Last Year: Not on file  Transportation Needs:   . Lack of Transportation (Medical): Not on file  .  Lack of Transportation (Non-Medical): Not on file  Physical Activity:   . Days of Exercise per Week: Not on file  . Minutes of Exercise per Session: Not on file  Stress:   . Feeling of Stress : Not on file  Social Connections:   . Frequency of Communication with Friends and Family: Not on file  . Frequency of Social Gatherings with Friends and Family: Not on file  . Attends Religious Services: Not on file  . Active Member of Clubs or Organizations: Not on file  . Attends Archivist Meetings: Not on file  . Marital Status: Not on file  Intimate Partner Violence:   . Fear of Current or Ex-Partner: Not on file  . Emotionally Abused: Not on file  . Physically Abused: Not on file  . Sexually Abused: Not on file   No current facility-administered medications on file prior to encounter.   Current Outpatient Medications on File Prior to Encounter  Medication Sig Dispense Refill  . famotidine (PEPCID) 20 MG tablet Take 20 mg by mouth daily.    . Multiple Vitamin (MULTIVITAMIN WITH MINERALS) TABS tablet Take 1 tablet by mouth daily.    Marland Kitchen docusate sodium (COLACE) 100 MG capsule Take 1 capsule (100 mg total) by mouth 2 (two) times daily. 60 capsule  0  . traMADol (ULTRAM) 50 MG tablet Take 1 tablet (50 mg total) by mouth every 6 (six) hours as needed for moderate pain (pain not relieved by tylenol or ibuprofen). 15 tablet 0   No Known Allergies  ROS:  Review of Systems   I have reviewed patient's Past Medical Hx, Surgical Hx, Family Hx, Social Hx, medications and allergies.   Physical Exam   Patient Vitals for the past 24 hrs:  BP Temp Temp src Pulse Resp SpO2 Height Weight  01/21/20 1201 135/80 -- -- 70 -- 100 % 5' 10.5" (1.791 m) 120.7 kg  01/21/20 1123 (!) 146/107 98.9 F (37.2 C) Oral 84 18 99 % -- --   Constitutional: Well-developed, well-nourished female in no acute distress.  Cardiovascular: normal rate Respiratory: normal effort GI: Abd soft, non-tender. Pos BS x  4 MS: Extremities nontender, no edema, normal ROM Neurologic: Alert and oriented x 4.  GU: Neg CVAT.  PELVIC EXAM: Cervix pink, visually closed, without lesion, small amount dark red bleeding requiring 1 fox swab to visualize cervix, vaginal walls and external genitalia normal Bimanual exam: Cervix 0/long/high, firm, anterior, neg CMT, uterus nontender, nonenlarged, adnexa without tenderness, enlargement, or mass   LAB RESULTS Results for orders placed or performed during the hospital encounter of 01/21/20 (from the past 24 hour(s))  CBC     Status: None   Collection Time: 01/21/20 12:13 PM  Result Value Ref Range   WBC 6.3 4.0 - 10.5 K/uL   RBC 4.63 3.87 - 5.11 MIL/uL   Hemoglobin 13.2 12.0 - 15.0 g/dL   HCT 39.8 36.0 - 46.0 %   MCV 86.0 80.0 - 100.0 fL   MCH 28.5 26.0 - 34.0 pg   MCHC 33.2 30.0 - 36.0 g/dL   RDW 12.5 11.5 - 15.5 %   Platelets 262 150 - 400 K/uL   nRBC 0.0 0.0 - 0.2 %  hCG, quantitative, pregnancy     Status: Abnormal   Collection Time: 01/21/20 12:13 PM  Result Value Ref Range   hCG, Beta Chain, Quant, S 98 (H) <5 mIU/mL  Wet prep, genital     Status: Abnormal   Collection Time: 01/21/20 12:19 PM  Result Value Ref Range   Yeast Wet Prep HPF POC NONE SEEN NONE SEEN   Trich, Wet Prep NONE SEEN NONE SEEN   Clue Cells Wet Prep HPF POC NONE SEEN NONE SEEN   WBC, Wet Prep HPF POC MANY (A) NONE SEEN   Sperm NONE SEEN   Urinalysis, Routine w reflex microscopic     Status: Abnormal   Collection Time: 01/21/20  1:25 PM  Result Value Ref Range   Color, Urine YELLOW YELLOW   APPearance HAZY (A) CLEAR   Specific Gravity, Urine 1.017 1.005 - 1.030   pH 6.0 5.0 - 8.0   Glucose, UA NEGATIVE NEGATIVE mg/dL   Hgb urine dipstick LARGE (A) NEGATIVE   Bilirubin Urine NEGATIVE NEGATIVE   Ketones, ur NEGATIVE NEGATIVE mg/dL   Protein, ur NEGATIVE NEGATIVE mg/dL   Nitrite NEGATIVE NEGATIVE   Leukocytes,Ua NEGATIVE NEGATIVE   RBC / HPF 0-5 0 - 5 RBC/hpf   WBC, UA 0-5 0 -  5 WBC/hpf   Bacteria, UA RARE (A) NONE SEEN   Squamous Epithelial / LPF 0-5 0 - 5   Mucus PRESENT   Comprehensive metabolic panel     Status: Abnormal   Collection Time: 01/21/20  3:33 PM  Result Value Ref Range   Sodium 138 135 -  145 mmol/L   Potassium 4.0 3.5 - 5.1 mmol/L   Chloride 105 98 - 111 mmol/L   CO2 24 22 - 32 mmol/L   Glucose, Bld 96 70 - 99 mg/dL   BUN 5 (L) 6 - 20 mg/dL   Creatinine, Ser 0.78 0.44 - 1.00 mg/dL   Calcium 8.9 8.9 - 10.3 mg/dL   Total Protein 6.7 6.5 - 8.1 g/dL   Albumin 3.8 3.5 - 5.0 g/dL   AST 16 15 - 41 U/L   ALT 17 0 - 44 U/L   Alkaline Phosphatase 74 38 - 126 U/L   Total Bilirubin 0.9 0.3 - 1.2 mg/dL   GFR calc non Af Amer >60 >60 mL/min   GFR calc Af Amer >60 >60 mL/min   Anion gap 9 5 - 15       IMAGING US OB LESS THAN 14 WEEKS WITH OB TRANSVAGINAL  Result Date: 01/21/2020 CLINICAL DATA:  Pregnancy.  Vaginal bleeding. EXAM: OBSTETRIC <14 WK [place] AND TRANSVAGINAL OB US TECHNIQUE: Both transabdominal and transvaginal ultrasound examinations were performed for complete evaluation of the gestation as well as the maternal uterus, adnexal regions, and pelvic cul-de-sac. Transvaginal technique was performed to assess early pregnancy. COMPARISON:  No prior. FINDINGS: Intrauterine gestational sac: Not visualized Yolk sac:  None visualized Embryo:  None visualized Cardiac Activity: None visualized Heart Rate:   bpm Subchorionic hemorrhage:  None visualized. Maternal uterus/adnexae: Questionable tiny hyperechoic focus in the left ovary. Small amount of free pelvic fluid. IMPRESSION: 1.  No intrauterine pregnancy identified. 2. Questionable tiny hyperechoic focus in the left ovary. This is of doubtful clinical significance, follow-up exam in 1 month suggested to demonstrate stability or resolution. Follow-up beta hCGs suggested to exclude ectopic pregnancy. Small amount of free pelvic fluid noted. Electronically Signed   By: Marcello Moores  Register   On: 01/21/2020 14:52     MAU Management/MDM: Orders Placed This Encounter  Procedures  . Wet prep, genital  . US OB LESS THAN 14 WEEKS WITH OB TRANSVAGINAL  . CBC  . hCG, quantitative, pregnancy  . Urinalysis, Routine w reflex microscopic  . Comprehensive metabolic panel  . Discharge patient    Meds ordered this encounter  Medications  . methotrexate [facility]) chemo injection kit 125 mg  . ondansetron (ZOFRAN) 4 MG tablet    Sig: Take 1 tablet (4 mg total) by mouth every 8 (eight) hours as needed for nausea or vomiting.    Dispense:  20 tablet    Refill:  0    Order Specific Question:   Supervising Provider    Answer:   Woodroe Mode [[id number]]    [place] does not indicate IUP, and no clear ectopic is visualized.  Quant hcg continues with inappropriate rise/plateau which is concerning for ectopic pregnancy.  Consult Dr Roselie Awkward with assessment and findings.  Recommend methotrexate for possible ectopic pregnancy. Discussed with pt who agrees to MTX treatment today.  Medication ordered and administered by RN.  Pt tolerated well.  D/C home with Rx for Zofran for nausea, pt declines pain medication.  Tylenol, heat, ice PRN for pain.  Day 4 labs in MAU, Day 7 on weekday but pt unable to get in during office hours due to childcare/husband's schedule so will return to MAU after 5.  Return sooner as needed for emergencies.     ASSESSMENT 1. Ectopic pregnancy without intrauterine pregnancy, unspecified location   2. Vaginal bleeding in pregnancy, first trimester   3. Inappropriate change in quantitative hCG  in early pregnancy     PLAN Discharge home Allergies as of 01/21/2020   No Known Allergies     Medication List    STOP taking these medications   traMADol 50 MG tablet Commonly known as: Ultram     TAKE these medications   docusate sodium 100 MG capsule Commonly known as: Colace Take 1 capsule (100 mg total) by mouth 2 (two) times daily.   famotidine 20 MG tablet Commonly known as: PEPCID Take 20 mg by  mouth daily.   multivitamin with minerals Tabs tablet Take 1 tablet by mouth daily.   ondansetron 4 MG tablet Commonly known as: Zofran Take 1 tablet (4 mg total) by mouth every 8 (eight) hours as needed for nausea or vomiting.        Fatima Blank Certified Nurse-Midwife 01/21/2020  4:58 PM

## 2020-01-21 NOTE — MAU Note (Signed)
Found out preg 1/19 when having pre-op work up for gallbladder surg.

## 2020-01-22 LAB — GC/CHLAMYDIA PROBE AMP (~~LOC~~) NOT AT ARMC
Chlamydia: NEGATIVE
Comment: NEGATIVE
Comment: NORMAL
Neisseria Gonorrhea: NEGATIVE

## 2020-01-24 ENCOUNTER — Inpatient Hospital Stay (HOSPITAL_COMMUNITY)
Admission: AD | Admit: 2020-01-24 | Discharge: 2020-01-24 | Disposition: A | Discharge status: Still In Hospital | Payer: Managed Care, Other (non HMO) | Attending: Obstetrics and Gynecology | Admitting: Obstetrics and Gynecology

## 2020-01-24 ENCOUNTER — Other Ambulatory Visit: Payer: Self-pay

## 2020-01-24 ENCOUNTER — Inpatient Hospital Stay (HOSPITAL_COMMUNITY)
Admission: AD | Admit: 2020-01-24 | Discharge: 2020-01-24 | Disposition: A | Discharge status: Home / Self Care | Payer: Managed Care, Other (non HMO) | Attending: Obstetrics and Gynecology | Admitting: Obstetrics and Gynecology

## 2020-01-24 DIAGNOSIS — Z79899 Other long term (current) drug therapy: Secondary | ICD-10-CM | POA: Diagnosis not present

## 2020-01-24 DIAGNOSIS — Z3A10 10 weeks gestation of pregnancy: Secondary | ICD-10-CM

## 2020-01-24 DIAGNOSIS — O00109 Unspecified tubal pregnancy without intrauterine pregnancy: Secondary | ICD-10-CM | POA: Diagnosis present

## 2020-01-24 LAB — HCG, QUANTITATIVE, PREGNANCY: hCG, Beta Chain, Quant, S: 113 m[IU]/mL — ABNORMAL HIGH (ref <5)

## 2020-01-24 NOTE — MAU Note (Signed)
Per [facility], pt can leave after blood is drawn. Pt to remain on the board until results are back.

## 2020-01-24 NOTE — MAU Provider Note (Signed)
First Provider Initiated Contact with Patient 01/24/20 1532     S Ms. Stephanie Wilkinson is a 29 y.o. G22P1001 female who is s/p Methotrexate administration on 01/21/2020 and presents to MAU for Day 4 lab work. She endorses minimal cramping and light bleeding which resolved this morning. She endorses feeling significantly better this morning than she has felt in a long time.  Patient states she is unable to wait for results due to transportation and child care issues. She verbalized to CNM that she will be close to hospital and able to return to MAU PRN.  O BP 136/72 (BP Location: Right Arm)   Pulse 82   Temp 99.1 F (37.3 C) (Oral)   Resp 17   Ht 5' 10.5" (1.791 m)   Wt 122.1 kg   LMP 11/17/2019   SpO2 100% Comment: room air  BMI 38.07 kg/m  Physical Exam  Constitutional: She is oriented to person, place, and time. She appears well-developed and well-nourished.  Cardiovascular: Normal rate.  Respiratory: Effort normal and breath sounds normal.  GI: Soft.  Neurological: She is alert and oriented to person, place, and time.  Skin: Skin is warm and dry.  Psychiatric: She has a normal mood and affect. Her behavior is normal. Judgment and thought content normal.   A Female, s/p Methotrexate on 01/21/2020 Medical screening exam complete  Slight rise from Day 0/1 (98)  Results for orders placed or performed during the hospital encounter of 01/24/20 (from the past 24 hour(s))  hCG, quantitative, pregnancy     Status: Abnormal   Collection Time: 01/24/20  3:55 PM  Result Value Ref Range   hCG, Beta Chain, Quant, S 113 (H) <5 mIU/mL   Methotrexate Treatment Protocol for Ectopic Pregnancy   Treatment day  Single dose protocol   1 01/28 hCG.  Administer Methotrexate 50 mg/m2 body surface area IM  4 01/31 hCG  7 02/03 hCG  If <15 percent hCG decline from day 4 to 7, give additional dose of methotrexate 50 mg/m2 IM  If ?15 percent hCG decline from day 4 to 7, draw hCG weekly until  undetectable    P Discharge from MAU in stable condition Patient may return to MAU as needed for pregnancy related complaints Patient was called with results at 1655.   F/U: Patient unable to secure a clinic appointment for Day 7 labs, will return to MAU after 5pm on 01/27/20  Clayton Bibles, CNM 01/24/2020 5:05 PM

## 2020-01-24 NOTE — MAU Note (Signed)
Stephanie Wilkinson is a 29 y.o. at [redacted]w[redacted]d here in MAU reporting: day 4 post MTX hcg level. No pain or bleeding.  Pain score: 0/10  Vitals:   01/24/20 1529  BP: 136/72  Pulse: 82  Resp: 17  Temp: 99.1 F (37.3 C)  SpO2: 100%     Lab orders placed from triage: hcg

## 2020-01-27 ENCOUNTER — Inpatient Hospital Stay (HOSPITAL_COMMUNITY)
Admission: AD | Admit: 2020-01-27 | Discharge: 2020-01-27 | Disposition: A | Discharge status: Home / Self Care | Payer: Managed Care, Other (non HMO) | Attending: Obstetrics & Gynecology | Admitting: Obstetrics & Gynecology

## 2020-01-27 ENCOUNTER — Other Ambulatory Visit: Payer: Self-pay

## 2020-01-27 DIAGNOSIS — Z3A10 10 weeks gestation of pregnancy: Secondary | ICD-10-CM

## 2020-01-27 DIAGNOSIS — O3680X0 Pregnancy with inconclusive fetal viability, not applicable or unspecified: Secondary | ICD-10-CM

## 2020-01-27 DIAGNOSIS — O0281 Inappropriate change in quantitative human chorionic gonadotropin (hCG) in early pregnancy: Secondary | ICD-10-CM

## 2020-01-27 DIAGNOSIS — Z9221 Personal history of antineoplastic chemotherapy: Secondary | ICD-10-CM

## 2020-01-27 LAB — HCG, QUANTITATIVE, PREGNANCY: hCG, Beta Chain, Quant, S: 108 m[IU]/mL — ABNORMAL HIGH (ref <5)

## 2020-01-27 MED ORDER — METHOTREXATE FOR ECTOPIC PREGNANCY
50.0000 mg/m2 | Freq: Once | INTRAMUSCULAR | Status: AC
  Administered 2020-01-27: 125 mg via INTRAMUSCULAR
  Filled 2020-01-27: qty 1

## 2020-01-27 NOTE — Progress Notes (Signed)
Information sheet regarding Methotrexate given & reviewed with pt prior to medication administration.  Pt understanding verbalized.

## 2020-01-27 NOTE — MAU Provider Note (Signed)
History   Chief Complaint:  Methotrexate Injection   Stephanie Wilkinson is  29 y.o. G2P1001 Patient's last menstrual period was 11/17/2019.Marland Kitchen Patient is here for follow up of quantitative HCG and ongoing surveillance of pregnancy status.   She is being treated for presumed ectopic, d/t abnormal rise in Hcg levels.  No pain today.   Since her last visit, the patient is with new complaint.   The patient reports bleeding as  spotting.    General ROS:  negative  Her previous Quantitative HCG values are: 64, 98, 113.  Day 4 113, day 7 108. Received report from Judeth Horn NP who spoke to Dr. Jolayne Panther. 2nd dose of MTX recommended.    Physical Exam  Blood pressure (!) 155/82, pulse 70, temperature 98.7 F (37.1 C), temperature source Oral, resp. rate 20, height 5' 10.5" (1.791 m), weight 122.5 kg, last menstrual period 11/17/2019, SpO2 100 %, unknown if currently breastfeeding.   Labs: Results for orders placed or performed during the hospital encounter of 01/27/20 (from the past 24 hour(s))  hCG, quantitative, pregnancy   Collection Time: 01/27/20  3:13 PM  Result Value Ref Range   hCG, Beta Chain, Quant, S 108 (H) <5 mIU/mL    Ultrasound Studies:   US OB LESS THAN 14 WEEKS WITH OB TRANSVAGINAL  Result Date: 01/21/2020 CLINICAL DATA:  Pregnancy.  Vaginal bleeding. EXAM: OBSTETRIC <14 WK [place] AND TRANSVAGINAL OB US TECHNIQUE: Both transabdominal and transvaginal ultrasound examinations were performed for complete evaluation of the gestation as well as the maternal uterus, adnexal regions, and pelvic cul-de-sac. Transvaginal technique was performed to assess early pregnancy. COMPARISON:  No prior. FINDINGS: Intrauterine gestational sac: Not visualized Yolk sac:  None visualized Embryo:  None visualized Cardiac Activity: None visualized Heart Rate:   bpm Subchorionic hemorrhage:  None visualized. Maternal uterus/adnexae: Questionable tiny hyperechoic focus in the left ovary. Small amount of  free pelvic fluid. IMPRESSION: 1.  No intrauterine pregnancy identified. 2. Questionable tiny hyperechoic focus in the left ovary. This is of doubtful clinical significance, follow-up exam in 1 month suggested to demonstrate stability or resolution. Follow-up beta hCGs suggested to exclude ectopic pregnancy. Small amount of free pelvic fluid noted. Electronically Signed   By: Maisie Fus  Register   On: 01/21/2020 14:52    Assessment:  1. Inappropriate change in quantitative hCG in early pregnancy   2. Pregnancy of unknown anatomic location   3. History of methotrexate therapy     Plan:  O positive blood type 2nd dose of MTX given today She needs day 4 labs on Saturday  Strict return precautions  Pelvic rest No pain currently.   Duane Lope, NP 01/28/2020 9:55 AM

## 2020-01-27 NOTE — Progress Notes (Signed)
HCG level drawn while pt in waiting area, left prior to being triaged or lab test resulted.

## 2020-01-27 NOTE — MAU Note (Signed)
Pt called by Estanislado Spire, NP to discuss lab results.  Pt reports she hadn't left, was sitting in car waiting for result. Pt instructed to return to MAU for MTX injection.

## 2020-01-30 ENCOUNTER — Other Ambulatory Visit: Payer: Self-pay

## 2020-01-30 ENCOUNTER — Inpatient Hospital Stay (HOSPITAL_COMMUNITY)
Admission: AD | Admit: 2020-01-30 | Discharge: 2020-01-30 | Disposition: A | Discharge status: Home / Self Care | Payer: Managed Care, Other (non HMO) | Attending: Obstetrics and Gynecology | Admitting: Obstetrics and Gynecology

## 2020-01-30 DIAGNOSIS — Z3A00 Weeks of gestation of pregnancy not specified: Secondary | ICD-10-CM | POA: Diagnosis not present

## 2020-01-30 DIAGNOSIS — O3680X0 Pregnancy with inconclusive fetal viability, not applicable or unspecified: Secondary | ICD-10-CM | POA: Insufficient documentation

## 2020-01-30 LAB — HCG, QUANTITATIVE, PREGNANCY: hCG, Beta Chain, Quant, S: 50 m[IU]/mL — ABNORMAL HIGH (ref <5)

## 2020-01-30 NOTE — Progress Notes (Addendum)
Patient Stephanie Wilkinson is a 29 y.o. G2P1001here for follow-up lab work. Beta was 108 on 01/27/2020. She received first dose of methotrexate on Thursday January 29 and then second dose on Wednesday, Feb 3. She is here for Day 4 labs.    Beta today is 50, appropriate drop in quant; patient reports some mild bleeding. She felt something "come out" and now the bleeding is tapering off.   Follow up quant on Tuesday at [facility]; patient will call to schedule appt for bHCG. Strict ectopic precautions given. MAU note forwarded to [facility].   Luna Kitchens

## 2020-01-30 NOTE — MAU Note (Signed)
Pt discharged by CNM in MAU family room

## 2020-01-30 NOTE — MAU Note (Signed)
Stephanie Wilkinson is a 29 y.o. at [redacted]w[redacted]d here in MAU reporting: here for day 4 labs post 2nd dose of MTX. Denies pain but is having some bleeding. States bleeding is like a period- is wearing a pad and changing every 4-6 hours, not saturated.   Pain score: 0/10  Vitals:   01/30/20 1519  BP: 132/82  Pulse: 63  Resp: 16  Temp: 98.8 F (37.1 C)  SpO2: 99%      Lab orders placed from triage: beta hcg

## 2020-01-30 NOTE — MAU Note (Signed)
Crisoforo Oxford CNM with see patient once results are back. Pt will wait in her car. Pt to return to lobby in 1 hour to receive results and see provider. Pt verbalizes understanding.

## 2020-02-25 ENCOUNTER — Ambulatory Visit: Payer: Self-pay | Admitting: Surgery

## 2020-03-03 NOTE — Patient Instructions (Signed)
DUE TO COVID-19 ONLY ONE VISITOR IS ALLOWED TO COME WITH YOU AND STAY IN THE WAITING ROOM ONLY DURING PRE OP AND PROCEDURE DAY OF SURGERY. THE 1 VISITOR MAY VISIT WITH YOU AFTER SURGERY IN YOUR PRIVATE ROOM DURING VISITING HOURS ONLY!   YOU NEED TO HAVE A COVID 19 TEST ON 3/18/21_______ @_2 :35______, THIS TEST MUST BE DONE BEFORE SURGERY, COME  [name] , [id number].  ([place]) ONCE YOUR COVID TEST IS COMPLETED, PLEASE BEGIN THE QUARANTINE INSTRUCTIONS AS OUTLINED IN YOUR HANDOUT.                Stephanie Wilkinson    Your procedure is scheduled on: 03/14/20   Report to [facility] Main  Entrance   Report to admitting at  8:30 AM     Call this number if you have problems the morning of surgery [phone]    Remember: Do not eat food or drink liquids :After Midnight.   BRUSH YOUR TEETH MORNING OF SURGERY AND RINSE YOUR MOUTH OUT, NO CHEWING GUM CANDY OR MINTS.     Take these medicines the morning of surgery with A SIP OF WATER: Pepcid                                 You may not have any metal on your body including hair pins and              piercings  Do not wear jewelry, make-up, lotions, powders or perfumes, deodorant             Do not wear nail polish on your fingernails.  Do not shave  48 hours prior to surgery.            Do not bring valuables to the hospital. [place].  Contacts, dentures or bridgework may not be worn into surgery.      Patients discharged the day of surgery will not be allowed to drive home.   IF YOU ARE HAVING SURGERY AND GOING HOME THE SAME DAY, YOU MUST HAVE AN ADULT TO DRIVE YOU HOME AND BE WITH YOU FOR 24 HOURS.   YOU MAY GO HOME BY TAXI OR UBER OR ORTHERWISE, BUT AN ADULT MUST ACCOMPANY YOU HOME AND STAY WITH YOU FOR 24 HOURS.  Name and phone number of your driver:  Special Instructions: N/A              Please read over the following fact  sheets you were given: _____________________________________________________________________             [facility] - Preparing for Surgery  Before surgery, you can play an important role.   Because skin is not sterile, your skin needs to be as free of germs as possible.   You can reduce the number of germs on your skin by washing with CHG (chlorahexidine gluconate) soap before surgery.   CHG is an antiseptic cleaner which kills germs and bonds with the skin to continue killing germs even after washing. Please DO NOT use if you have an allergy to CHG or antibacterial soaps.   If your skin becomes reddened/irritated stop using the CHG and inform your nurse when you arrive at Short Stay. Do not shave (including legs and underarms) for at least 48 hours  prior to the first CHG shower.    Please follow these instructions carefully:  1.  Shower with CHG Soap the night before surgery and the  morning of Surgery.  2.  If you choose to wash your hair, wash your hair first as usual with your  normal  shampoo.  3.  After you shampoo, rinse your hair and body thoroughly to remove the  shampoo.                                        4.  Use CHG as you would any other liquid soap.  You can apply chg directly  to the skin and wash                       Gently with a scrungie or clean washcloth.  5.  Apply the CHG Soap to your body ONLY FROM THE NECK DOWN.   Do not use on face/ open                           Wound or open sores. Avoid contact with eyes, ears mouth and genitals (private parts).                       Wash face,  Genitals (private parts) with your normal soap.             6.  Wash thoroughly, paying special attention to the area where your surgery  will be performed.  7.  Thoroughly rinse your body with warm water from the neck down.  8.  DO NOT shower/wash with your normal soap after using and rinsing off  the CHG Soap.             9.  Pat yourself dry with a clean towel.            10.   Wear clean pajamas.            11.  Place clean sheets on your bed the night of your first shower and do not  sleep with pets. Day of Surgery : Do not apply any lotions/deodorants the morning of surgery.  Please wear clean clothes to the hospital/surgery center.  FAILURE TO FOLLOW THESE INSTRUCTIONS MAY RESULT IN THE CANCELLATION OF YOUR SURGERY PATIENT SIGNATURE_________________________________  NURSE SIGNATURE__________________________________  ________________________________________________________________________

## 2020-03-04 ENCOUNTER — Other Ambulatory Visit: Payer: Self-pay

## 2020-03-04 ENCOUNTER — Encounter (HOSPITAL_COMMUNITY): Payer: Self-pay

## 2020-03-04 ENCOUNTER — Encounter (HOSPITAL_COMMUNITY)
Admission: RE | Admit: 2020-03-04 | Discharge: 2020-03-04 | Disposition: A | Discharge status: Home / Self Care | Payer: Managed Care, Other (non HMO) | Source: Ambulatory Visit | Attending: Surgery | Admitting: Surgery

## 2020-03-04 DIAGNOSIS — Z01812 Encounter for preprocedural laboratory examination: Secondary | ICD-10-CM | POA: Insufficient documentation

## 2020-03-07 ENCOUNTER — Other Ambulatory Visit: Payer: Self-pay

## 2020-03-07 ENCOUNTER — Encounter (HOSPITAL_COMMUNITY)
Admission: RE | Admit: 2020-03-07 | Discharge: 2020-03-07 | Disposition: A | Discharge status: Home / Self Care | Payer: Managed Care, Other (non HMO) | Source: Ambulatory Visit | Attending: Surgery | Admitting: Surgery

## 2020-03-07 DIAGNOSIS — Z01812 Encounter for preprocedural laboratory examination: Secondary | ICD-10-CM | POA: Diagnosis not present

## 2020-03-07 LAB — BASIC METABOLIC PANEL
Anion gap: 11 (ref 5–15)
BUN: 12 mg/dL (ref 6–20)
CO2: 23 mmol/L (ref 22–32)
Calcium: 9.2 mg/dL (ref 8.9–10.3)
Chloride: 106 mmol/L (ref 98–111)
Creatinine, Ser: 0.68 mg/dL (ref 0.44–1.00)
GFR calc Af Amer: >60 mL/min (ref >60)
GFR calc non Af Amer: >60 mL/min (ref >60)
Glucose, Bld: 102 mg/dL — ABNORMAL HIGH (ref 70–99)
Potassium: 4 mmol/L (ref 3.5–5.1)
Sodium: 140 mmol/L (ref 135–145)

## 2020-03-07 LAB — CBC
HCT: 38.9 % (ref 36.0–46.0)
Hemoglobin: 12.6 g/dL (ref 12.0–15.0)
MCH: 28.3 pg (ref 26.0–34.0)
MCHC: 32.4 g/dL (ref 30.0–36.0)
MCV: 87.2 fL (ref 80.0–100.0)
Platelets: 244 10*3/uL (ref 150–400)
RBC: 4.46 MIL/uL (ref 3.87–5.11)
RDW: 12.6 % (ref 11.5–15.5)
WBC: 6.6 10*3/uL (ref 4.0–10.5)
nRBC: 0 % (ref 0.0–0.2)

## 2020-03-10 ENCOUNTER — Other Ambulatory Visit (HOSPITAL_COMMUNITY)
Admission: RE | Admit: 2020-03-10 | Discharge: 2020-03-10 | Disposition: A | Discharge status: Home / Self Care | Payer: Managed Care, Other (non HMO) | Source: Ambulatory Visit | Attending: Surgery | Admitting: Surgery

## 2020-03-10 DIAGNOSIS — Z20822 Contact with and (suspected) exposure to covid-19: Secondary | ICD-10-CM | POA: Diagnosis not present

## 2020-03-10 DIAGNOSIS — Z01812 Encounter for preprocedural laboratory examination: Secondary | ICD-10-CM | POA: Diagnosis present

## 2020-03-10 LAB — SARS CORONAVIRUS 2 (TAT 6-24 HRS): SARS Coronavirus 2: NEGATIVE

## 2020-03-13 MED ORDER — BUPIVACAINE LIPOSOME 1.3 % IJ SUSP
20.0000 mL | INTRAMUSCULAR | Status: DC
  Filled 2020-03-13: qty 20

## 2020-03-13 MED ORDER — DEXTROSE 5 % IV SOLN
3.0000 g | INTRAVENOUS | Status: AC
  Administered 2020-03-14: 3 g via INTRAVENOUS
  Filled 2020-03-13: qty 3

## 2020-03-14 ENCOUNTER — Ambulatory Visit (HOSPITAL_COMMUNITY)
Admission: RE | Admit: 2020-03-14 | Discharge: 2020-03-14 | Disposition: A | Discharge status: Home / Self Care | Payer: Managed Care, Other (non HMO) | Attending: Surgery | Admitting: Surgery

## 2020-03-14 ENCOUNTER — Ambulatory Visit (HOSPITAL_COMMUNITY): Payer: Managed Care, Other (non HMO) | Admitting: Physician Assistant

## 2020-03-14 ENCOUNTER — Encounter (HOSPITAL_COMMUNITY): Payer: Self-pay | Admitting: Surgery

## 2020-03-14 ENCOUNTER — Ambulatory Visit (HOSPITAL_COMMUNITY): Payer: Managed Care, Other (non HMO) | Admitting: Certified Registered"

## 2020-03-14 ENCOUNTER — Encounter (HOSPITAL_COMMUNITY)
Admission: RE | Disposition: A | Discharge status: Home / Self Care | Payer: Self-pay | Source: Home / Self Care | Attending: Surgery

## 2020-03-14 DIAGNOSIS — Z87891 Personal history of nicotine dependence: Secondary | ICD-10-CM | POA: Diagnosis not present

## 2020-03-14 DIAGNOSIS — K219 Gastro-esophageal reflux disease without esophagitis: Secondary | ICD-10-CM | POA: Diagnosis not present

## 2020-03-14 DIAGNOSIS — R109 Unspecified abdominal pain: Secondary | ICD-10-CM | POA: Diagnosis present

## 2020-03-14 DIAGNOSIS — Z79899 Other long term (current) drug therapy: Secondary | ICD-10-CM | POA: Diagnosis not present

## 2020-03-14 DIAGNOSIS — K811 Chronic cholecystitis: Secondary | ICD-10-CM | POA: Insufficient documentation

## 2020-03-14 LAB — PREGNANCY, URINE: Preg Test, Ur: NEGATIVE

## 2020-03-14 SURGERY — CHOLECYSTECTOMY, ROBOT-ASSISTED, LAPAROSCOPIC
Anesthesia: General | Site: Abdomen

## 2020-03-14 MED ORDER — GABAPENTIN 300 MG PO CAPS
300.0000 mg | ORAL_CAPSULE | ORAL | Status: AC
  Administered 2020-03-14: 300 mg via ORAL
  Filled 2020-03-14: qty 1

## 2020-03-14 MED ORDER — ONDANSETRON 4 MG PO TBDP
ORAL_TABLET | ORAL | Status: AC
  Filled 2020-03-14: qty 1

## 2020-03-14 MED ORDER — MEPERIDINE HCL 50 MG/ML IJ SOLN: 6.2500 mg | INTRAMUSCULAR | Status: DC | PRN

## 2020-03-14 MED ORDER — MIDAZOLAM HCL 2 MG/2ML IJ SOLN
INTRAMUSCULAR | Status: AC
  Filled 2020-03-14: qty 2

## 2020-03-14 MED ORDER — LACTATED RINGERS IR SOLN
Status: DC | PRN
  Administered 2020-03-14: 1000 mL

## 2020-03-14 MED ORDER — KETOROLAC TROMETHAMINE 30 MG/ML IJ SOLN: 30.0000 mg | Freq: Once | INTRAMUSCULAR | Status: DC | PRN

## 2020-03-14 MED ORDER — BUPIVACAINE HCL (PF) 0.25 % IJ SOLN
INTRAMUSCULAR | Status: DC | PRN
  Administered 2020-03-14: 30 mL

## 2020-03-14 MED ORDER — OXYCODONE HCL 5 MG PO TABS: 5.0000 mg | ORAL_TABLET | Freq: Once | ORAL | Status: DC | PRN

## 2020-03-14 MED ORDER — SUGAMMADEX SODIUM 200 MG/2ML IV SOLN
INTRAVENOUS | Status: DC | PRN
  Administered 2020-03-14: 400 mg via INTRAVENOUS

## 2020-03-14 MED ORDER — MIDAZOLAM HCL 2 MG/2ML IJ SOLN
INTRAMUSCULAR | Status: DC | PRN
  Administered 2020-03-14 (×2): 1 mg via INTRAVENOUS

## 2020-03-14 MED ORDER — ONDANSETRON HCL 4 MG/2ML IJ SOLN
INTRAMUSCULAR | Status: AC
  Filled 2020-03-14: qty 2

## 2020-03-14 MED ORDER — ACETAMINOPHEN 325 MG PO TABS: 325.0000 mg | ORAL_TABLET | ORAL | Status: DC | PRN

## 2020-03-14 MED ORDER — DEXAMETHASONE SODIUM PHOSPHATE 10 MG/ML IJ SOLN
INTRAMUSCULAR | Status: DC | PRN
  Administered 2020-03-14: 6 mg via INTRAVENOUS

## 2020-03-14 MED ORDER — LIDOCAINE 2% (20 MG/ML) 5 ML SYRINGE
INTRAMUSCULAR | Status: DC | PRN
  Administered 2020-03-14 (×2): 100 mg via INTRAVENOUS

## 2020-03-14 MED ORDER — PROPOFOL 10 MG/ML IV BOLUS
INTRAVENOUS | Status: AC
  Filled 2020-03-14: qty 20

## 2020-03-14 MED ORDER — CHLORHEXIDINE GLUCONATE 4 % EX LIQD: 60.0000 mL | Freq: Once | CUTANEOUS | Status: DC

## 2020-03-14 MED ORDER — LACTATED RINGERS IV SOLN: INTRAVENOUS | Status: DC

## 2020-03-14 MED ORDER — BUPIVACAINE LIPOSOME 1.3 % IJ SUSP
INTRAMUSCULAR | Status: DC | PRN
  Administered 2020-03-14: 20 mL

## 2020-03-14 MED ORDER — ONDANSETRON 4 MG PO TBDP: 4.0000 mg | ORAL_TABLET | Freq: Three times a day (TID) | ORAL | 0 refills | Status: DC | PRN

## 2020-03-14 MED ORDER — FENTANYL CITRATE (PF) 250 MCG/5ML IJ SOLN
INTRAMUSCULAR | Status: DC | PRN
  Administered 2020-03-14: 50 ug via INTRAVENOUS
  Administered 2020-03-14: 100 ug via INTRAVENOUS
  Administered 2020-03-14 (×3): 50 ug via INTRAVENOUS
  Administered 2020-03-14 (×2): 100 ug via INTRAVENOUS

## 2020-03-14 MED ORDER — INDOCYANINE GREEN 25 MG IV SOLR
2.5000 mg | Freq: Once | INTRAVENOUS | Status: AC
  Administered 2020-03-14: 2.5 mg via INTRAVENOUS
  Filled 2020-03-14: qty 1

## 2020-03-14 MED ORDER — BUPIVACAINE HCL (PF) 0.25 % IJ SOLN
INTRAMUSCULAR | Status: AC
  Filled 2020-03-14: qty 30

## 2020-03-14 MED ORDER — FENTANYL CITRATE (PF) 250 MCG/5ML IJ SOLN
INTRAMUSCULAR | Status: AC
  Filled 2020-03-14: qty 5

## 2020-03-14 MED ORDER — OXYCODONE HCL 5 MG/5ML PO SOLN: 5.0000 mg | Freq: Once | ORAL | Status: DC | PRN

## 2020-03-14 MED ORDER — TRAMADOL HCL 50 MG PO TABS: 50.0000 mg | ORAL_TABLET | Freq: Four times a day (QID) | ORAL | 0 refills | Status: DC | PRN

## 2020-03-14 MED ORDER — ACETAMINOPHEN 160 MG/5ML PO SOLN: 325.0000 mg | ORAL | Status: DC | PRN

## 2020-03-14 MED ORDER — DOCUSATE SODIUM 100 MG PO CAPS: 100.0000 mg | ORAL_CAPSULE | Freq: Two times a day (BID) | ORAL | 0 refills | Status: AC

## 2020-03-14 MED ORDER — 0.9 % SODIUM CHLORIDE (POUR BTL) OPTIME
TOPICAL | Status: DC | PRN
  Administered 2020-03-14: 1000 mL

## 2020-03-14 MED ORDER — ACETAMINOPHEN 500 MG PO TABS
1000.0000 mg | ORAL_TABLET | ORAL | Status: AC
  Administered 2020-03-14: 1000 mg via ORAL
  Filled 2020-03-14: qty 2

## 2020-03-14 MED ORDER — ROCURONIUM BROMIDE 10 MG/ML (PF) SYRINGE
PREFILLED_SYRINGE | INTRAVENOUS | Status: DC | PRN
  Administered 2020-03-14: 70 mg via INTRAVENOUS
  Administered 2020-03-14: 10 mg via INTRAVENOUS

## 2020-03-14 MED ORDER — PROPOFOL 10 MG/ML IV BOLUS
INTRAVENOUS | Status: DC | PRN
  Administered 2020-03-14: 200 mg via INTRAVENOUS

## 2020-03-14 MED ORDER — ONDANSETRON HCL 4 MG/2ML IJ SOLN
INTRAMUSCULAR | Status: DC | PRN
  Administered 2020-03-14: 4 mg via INTRAVENOUS

## 2020-03-14 MED ORDER — FENTANYL CITRATE (PF) 100 MCG/2ML IJ SOLN: 25.0000 ug | INTRAMUSCULAR | Status: DC | PRN

## 2020-03-14 MED ORDER — ONDANSETRON 4 MG PO TBDP
4.0000 mg | ORAL_TABLET | Freq: Once | ORAL | Status: AC
  Administered 2020-03-14: 4 mg via ORAL

## 2020-03-14 MED ORDER — ONDANSETRON HCL 4 MG/2ML IJ SOLN
4.0000 mg | Freq: Once | INTRAMUSCULAR | Status: AC | PRN
  Administered 2020-03-14: 12:00:00 4 mg via INTRAVENOUS

## 2020-03-14 SURGICAL SUPPLY — 64 items
ADH SKN CLS APL DERMABOND .7 (GAUZE/BANDAGES/DRESSINGS)
APL PRP STRL LF DISP 70% ISPRP (MISCELLANEOUS) ×1
APPLIER CLIP 5 13 M/L LIGAMAX5 (MISCELLANEOUS)
APR CLP MED LRG 5 ANG JAW (MISCELLANEOUS)
BLADE SURG SZ11 CARB STEEL (BLADE) ×3 IMPLANT
CHLORAPREP W/TINT 26 (MISCELLANEOUS) ×3 IMPLANT
CLIP APPLIE 5 13 M/L LIGAMAX5 (MISCELLANEOUS) IMPLANT
CLIP VESOLOCK LG 6/CT PURPLE (CLIP) ×2 IMPLANT
COVER TIP SHEARS 8 DVNC (MISCELLANEOUS) ×1 IMPLANT
COVER TIP SHEARS 8MM DA VINCI (MISCELLANEOUS) ×3
COVER WAND RF STERILE (DRAPES) ×3 IMPLANT
DECANTER SPIKE VIAL GLASS SM (MISCELLANEOUS) ×3 IMPLANT
DERMABOND ADVANCED (GAUZE/BANDAGES/DRESSINGS)
DERMABOND ADVANCED .7 DNX12 (GAUZE/BANDAGES/DRESSINGS) IMPLANT
DRAPE ARM DVNC X/XI (DISPOSABLE) ×4 IMPLANT
DRAPE COLUMN DVNC XI (DISPOSABLE) ×1 IMPLANT
DRAPE DA VINCI XI ARM (DISPOSABLE) ×12
DRAPE DA VINCI XI COLUMN (DISPOSABLE) ×3
DRAPE SHEET LG 3/4 BI-LAMINATE (DRAPES) ×2 IMPLANT
ELECT REM PT RETURN 15FT ADLT (MISCELLANEOUS) ×3 IMPLANT
GAUZE 4X4 16PLY RFD (DISPOSABLE) ×3 IMPLANT
GAUZE SPONGE 2X2 8PLY STRL LF (GAUZE/BANDAGES/DRESSINGS) IMPLANT
GLOVE BIO SURGEON STRL SZ 6 (GLOVE) ×6 IMPLANT
GLOVE BIOGEL PI IND STRL 6.5 (GLOVE) IMPLANT
GLOVE BIOGEL PI IND STRL 7.0 (GLOVE) IMPLANT
GLOVE BIOGEL PI INDICATOR 6.5 (GLOVE) ×2
GLOVE BIOGEL PI INDICATOR 7.0 (GLOVE) ×4
GLOVE INDICATOR 6.5 STRL GRN (GLOVE) ×6 IMPLANT
GLOVE SURG SS PI 7.0 STRL IVOR (GLOVE) ×4 IMPLANT
GOWN STRL REUS W/TWL LRG LVL3 (GOWN DISPOSABLE) ×8 IMPLANT
GOWN STRL REUS W/TWL XL LVL3 (GOWN DISPOSABLE) ×2 IMPLANT
GRASPER SUT TROCAR 14GX15 (MISCELLANEOUS) ×2 IMPLANT
IRRIGATOR SUCT 8 DISP DVNC XI (IRRIGATION / IRRIGATOR) IMPLANT
IRRIGATOR SUCTION 8MM XI DISP (IRRIGATION / IRRIGATOR)
KIT BASIN OR (CUSTOM PROCEDURE TRAY) ×3 IMPLANT
KIT PROCEDURE DA VINCI SI (MISCELLANEOUS)
KIT PROCEDURE DVNC SI (MISCELLANEOUS) IMPLANT
KIT TURNOVER KIT A (KITS) IMPLANT
MANIFOLD NEPTUNE II (INSTRUMENTS) ×3 IMPLANT
NDL INSUFFLATION 14GA 120MM (NEEDLE) ×1 IMPLANT
NEEDLE HYPO 22GX1.5 SAFETY (NEEDLE) ×3 IMPLANT
NEEDLE INSUFFLATION 14GA 120MM (NEEDLE) ×3 IMPLANT
PACK CARDIOVASCULAR III (CUSTOM PROCEDURE TRAY) ×3 IMPLANT
PENCIL SMOKE EVACUATOR (MISCELLANEOUS) IMPLANT
SEAL CANN UNIV 5-8 DVNC XI (MISCELLANEOUS) ×4 IMPLANT
SEAL XI 5MM-8MM UNIVERSAL (MISCELLANEOUS) ×12
SEALER VESSEL DA VINCI XI (MISCELLANEOUS)
SEALER VESSEL EXT DVNC XI (MISCELLANEOUS) IMPLANT
SET IRRIG TUBING LAPAROSCOPIC (IRRIGATION / IRRIGATOR) ×2 IMPLANT
SOL ANTI FOG 6CC (MISCELLANEOUS) ×1 IMPLANT
SOLUTION ANTI FOG 6CC (MISCELLANEOUS) ×2
SOLUTION ELECTROLUBE (MISCELLANEOUS) ×3 IMPLANT
SPONGE GAUZE 2X2 STER 10/PKG (GAUZE/BANDAGES/DRESSINGS)
SPONGE LAP 18X18 RF (DISPOSABLE) ×3 IMPLANT
SUT MNCRL AB 4-0 PS2 18 (SUTURE) ×5 IMPLANT
SUT VICRYL 0 UR6 27IN ABS (SUTURE) ×2 IMPLANT
SYR CONTROL 10ML LL (SYRINGE) ×3 IMPLANT
SYS RETRIEVAL 5MM INZII UNIV (BASKET) ×3
SYSTEM RETRIEVL 5MM INZII UNIV (BASKET) ×1 IMPLANT
TOWEL OR 17X26 10 PK STRL BLUE (TOWEL DISPOSABLE) ×3 IMPLANT
TOWEL OR NON WOVEN STRL DISP B (DISPOSABLE) ×3 IMPLANT
TRAY FOLEY MTR SLVR 16FR STAT (SET/KITS/TRAYS/PACK) IMPLANT
TROCAR BLADELESS OPT 5 100 (ENDOMECHANICALS) ×3 IMPLANT
TUBING INSUFFLATION 10FT LAP (TUBING) ×3 IMPLANT

## 2020-03-14 NOTE — H&P (Signed)
Surgical H&P  Chief Complaint: abdominal pain  HPI: Returns for cholecystectomy. Was originally scheduled in January but pregnancy test was positive- this turned out to be an ectopic, and has been treated medically.  No Known Allergies  Past Medical History:  Diagnosis Date  . Medical history non-contributory     Past Surgical History:  Procedure Laterality Date  . NO PAST SURGERIES      No family history on file.  Social History   Socioeconomic History  . Marital status: Married    Spouse name: Not on file  . Number of children: Not on file  . Years of education: Not on file  . Highest education level: Not on file  Occupational History  . Not on file  Tobacco Use  . Smoking status: Former Smoker    Quit date: 08/24/2012    Years since quitting: 7.5  . Smokeless tobacco: Never Used  Substance and Sexual Activity  . Alcohol use: Yes    Alcohol/week: 1.0 standard drinks    Types: 1 Standard drinks or equivalent per week  . Drug use: No  . Sexual activity: Yes  Other Topics Concern  . Not on file  Social History Narrative  . Not on file   Social Determinants of Health   Financial Resource Strain:   . Difficulty of Paying Living Expenses:   Food Insecurity:   . Worried About Charity fundraiser in the Last Year:   . Arboriculturist in the Last Year:   Transportation Needs:   . Film/video editor (Medical):   Marland Kitchen Lack of Transportation (Non-Medical):   Physical Activity:   . Days of Exercise per Week:   . Minutes of Exercise per Session:   Stress:   . Feeling of Stress :   Social Connections:   . Frequency of Communication with Friends and Family:   . Frequency of Social Gatherings with Friends and Family:   . Attends Religious Services:   . Active Member of Clubs or Organizations:   . Attends Archivist Meetings:   Marland Kitchen Marital Status:     No current facility-administered medications on file prior to encounter.   Current Outpatient  Medications on File Prior to Encounter  Medication Sig Dispense Refill  . famotidine (PEPCID) 20 MG tablet Take 20 mg by mouth daily.    . meclizine (ANTIVERT) 25 MG tablet Take 25 mg by mouth 3 (three) times daily as needed (vertigo).    . Multiple Vitamin (MULTIVITAMIN WITH MINERALS) TABS tablet Take 1 tablet by mouth daily. Women's Multivitamin    . Omega-3 Fatty Acids (FISH OIL) 1200 MG CAPS Take 1,200 mg by mouth daily.    . vitamin B-12 (CYANOCOBALAMIN) 1000 MCG tablet Take 1,000 mcg by mouth daily.    . ondansetron (ZOFRAN) 4 MG tablet Take 1 tablet (4 mg total) by mouth every 8 (eight) hours as needed for nausea or vomiting. (Patient not taking: Reported on 03/01/2020) 20 tablet 0    Review of Systems: a complete, 10pt review of systems was completed with pertinent positives and negatives as documented in the HPI  Physical Exam: A&Ox3   CBC Latest Ref Rng & Units 03/07/2020 01/21/2020 01/07/2020  WBC 4.0 - 10.5 K/uL 6.6 6.3 6.7  Hemoglobin 12.0 - 15.0 g/dL 12.6 13.2 12.7  Hematocrit 36.0 - 46.0 % 38.9 39.8 40.3  Platelets 150 - 400 K/uL 244 262 253    CMP Latest Ref Rng & Units 03/07/2020 01/21/2020 01/07/2020  Glucose 70 - 99 mg/dL 182(X) 96 93  BUN 6 - 20 mg/dL 12 5(L) 11  Creatinine 0.44 - 1.00 mg/dL 9.37 1.69 6.78  Sodium 135 - 145 mmol/L 140 138 139  Potassium 3.5 - 5.1 mmol/L 4.0 4.0 4.5  Chloride 98 - 111 mmol/L 106 105 104  CO2 22 - 32 mmol/L 23 24 25   Calcium 8.9 - 10.3 mg/dL 9.2 8.9 9.4  Total Protein 6.5 - 8.1 g/dL - 6.7 7.5  Total Bilirubin 0.3 - 1.2 mg/dL - 0.9 0.6  Alkaline Phos 38 - 126 U/L - 74 82  AST 15 - 41 U/L - 16 15  ALT 0 - 44 U/L - 17 19    No results found for: INR, PROTIME  Imaging: No results found.   A/P: To OR for cholecystectomy. We have previously discussed the surgery including technique, risks, benefits, alternatives. Plan discharge home post-op.  Patient Active Problem List   Diagnosis Date Noted  . Term pregnancy 01/11/2017        01/13/2017, MD [facility]  See AMION to contact appropriate on-call provider

## 2020-03-14 NOTE — Transfer of Care (Signed)
Immediate Anesthesia Transfer of Care Note  Patient: Stephanie Wilkinson  Procedure(s) Performed: XI ROBOTIC ASSISTED LAPAROSCOPIC CHOLECYSTECTOMY (N/A Abdomen)  Patient Location: PACU  Anesthesia Type:General  Level of Consciousness: awake and alert   Airway & Oxygen Therapy: Patient Spontanous Breathing and Patient connected to face mask oxygen  Post-op Assessment: Report given to RN and Post -op Vital signs reviewed and stable  Post vital signs: Reviewed and stable  Last Vitals:  Vitals Value Taken Time  BP 161/124 03/14/20 1120  Temp    Pulse 96 03/14/20 1123  Resp 14 03/14/20 1123  SpO2 99 % 03/14/20 1123  Vitals shown include unvalidated device data.  Last Pain:  Vitals:   03/14/20 0859  TempSrc:   PainSc: 0-No pain         Complications: No apparent anesthesia complications

## 2020-03-14 NOTE — Anesthesia Procedure Notes (Signed)
Procedure Name: Intubation Date/Time: 03/14/2020 9:49 AM Performed by: Minerva Ends, CRNA Pre-anesthesia Checklist: Patient identified, Emergency Drugs available, Suction available and Patient being monitored Patient Re-evaluated:Patient Re-evaluated prior to induction Oxygen Delivery Method: Circle System Utilized Preoxygenation: Pre-oxygenation with 100% oxygen Induction Type: IV induction Ventilation: Mask ventilation without difficulty Laryngoscope Size: Miller and 2 Grade View: Grade I Tube type: Oral Number of attempts: 1 Airway Equipment and Method: Stylet Placement Confirmation: ETT inserted through vocal cords under direct vision,  positive ETCO2 and breath sounds checked- equal and bilateral Secured at: 21 cm Tube secured with: Tape Dental Injury: Teeth and Oropharynx as per pre-operative assessment  Comments: Smooth IV induction Hatchette-- intubation AM CRNA atraumatic-- teeth and mouth as preop-- bilat BS

## 2020-03-14 NOTE — Anesthesia Postprocedure Evaluation (Signed)
Anesthesia Post Note  Patient: Stephanie Wilkinson  Procedure(s) Performed: XI ROBOTIC ASSISTED LAPAROSCOPIC CHOLECYSTECTOMY (N/A Abdomen)     Patient location during evaluation: Phase II Anesthesia Type: General Level of consciousness: awake Pain management: pain level controlled Vital Signs Assessment: post-procedure vital signs reviewed and stable Respiratory status: spontaneous breathing Cardiovascular status: stable Postop Assessment: no apparent nausea or vomiting Anesthetic complications: no    Last Vitals:  Vitals:   03/14/20 1230 03/14/20 1240  BP: 119/72 (!) 143/94  Pulse: 76 90  Resp: 14 16  Temp: 36.8 C 36.9 C  SpO2: 98% 97%    Last Pain:  Vitals:   03/14/20 1230  TempSrc:   PainSc: 0-No pain   Pain Goal:                   Caren Macadam

## 2020-03-14 NOTE — Op Note (Signed)
Operative Note  NEKIA MAXHAM  [id number]  [id number]  03/14/2020   Surgeon: Lady Deutscher ConnorMD  Assistant: Feliciana Rossetti MD  Procedure performed: Birdie Sons Robotic cholecystectomy  Preop diagnosis: cholecystitis Post-op diagnosis/intraop findings: same  Specimens: gallbladder Retained items: no EBL: minimal cc Complications: none  Description of procedure: After obtaining informed consent the patient was taken to the operating room and placed supine on operating room table wheregeneral endotracheal anesthesia was initiated, preoperative antibiotics were administered, SCDs applied, and a formal timeout was performed.  Peritoneal access was gained with an infraumbilical Veress needle and insufflation to 15 mmHg ensued without incident.  An 8 mm trocar and camera were inserted.  Gross inspection demonstrated no injury from our entry.  The patient was placed in reverse Trendelenburg.  Bilateral taps blocks were performed with Exparel mixed with quarter percent Marcaine.  Under direct visualization 3 additional 8 mm trochars were placed.  The robot was then docked and instruments inserted under direct visualization.   The gallbladder was retracted cephalad and noted to have a significant amount of omental adhesion and thickening of the peritoneum consistent with cholecystitis.  These adhesions were taken down bluntly and with cautery were indicated.  Once the infundibulum was exposed, this was retracted laterally and dissection continued until we were able to expose the junction of the gallbladder neck and cystic duct, lifting the gallbladder from the cystic plate, and isolating cystic artery. The critical view of safety was achieved. The artery was diminutive and this was divided with cautery.  Firefly was used and was concordant.  There was significant fibrosis and scarring along the cystic duct more proximally; this was left undisturbed to avoid risking injury to porta structures.  3  clips were placed on the proximal cystic duct and one at the junction of the gallbladder neck and cystic duct and the duct was divided.  The gallbladder was dissected from the liver bed using cautery.  This was placed in an Endo Catch bag and removed intact through the left upper quadrant trocar site.   The right upper quadrant was inspected.  The field was hemostatic and there was no bile leak from the cystic duct or liver bed on direct inspection nor with firefly.  Right upper quadrant was irrigated and aspirated, the effluent was clear.  The robotic instruments were removed, robot undocked, abdomen was desufflated and all trochars removed.  The incisions were closed with subcuticular Monocryl and Dermabond.  The patient was then awakened, extubated and taken to PACU in stable condition.   All counts were correct at the completion of the case.

## 2020-03-14 NOTE — Anesthesia Procedure Notes (Signed)
Date/Time: 03/14/2020 11:15 AM Performed by: Minerva Ends, CRNA Oxygen Delivery Method: Simple face mask Placement Confirmation: positive ETCO2 and breath sounds checked- equal and bilateral Dental Injury: Teeth and Oropharynx as per pre-operative assessment

## 2020-03-14 NOTE — Anesthesia Preprocedure Evaluation (Signed)
Anesthesia Evaluation  Patient identified by MRN, date of birth, ID band Patient awake    Reviewed: Allergy & Precautions, NPO status , Patient's Chart, lab work & pertinent test results  Airway Mallampati: II       Dental no notable dental hx. (+) Teeth Intact   Pulmonary former smoker,    Pulmonary exam normal breath sounds clear to auscultation       Cardiovascular negative cardio ROS Normal cardiovascular exam Rhythm:Regular Rate:Normal     Neuro/Psych negative neurological ROS  negative psych ROS   GI/Hepatic Neg liver ROS, GERD  Medicated and Controlled,  Endo/Other  negative endocrine ROS  Renal/GU negative Renal ROS  negative genitourinary   Musculoskeletal negative musculoskeletal ROS (+)   Abdominal (+) + obese,   Peds negative pediatric ROS (+)  Hematology negative hematology ROS (+)   Anesthesia Other Findings   Reproductive/Obstetrics negative OB ROS                             Anesthesia Physical Anesthesia Plan  ASA: II  Anesthesia Plan: General   Post-op Pain Management:    Induction: Intravenous  PONV Risk Score and Plan: 4 or greater and Ondansetron, Dexamethasone and Midazolam  Airway Management Planned: Oral ETT  Additional Equipment: None  Intra-op Plan:   Post-operative Plan: Extubation in OR  Informed Consent: I have reviewed the patients History and Physical, chart, labs and discussed the procedure including the risks, benefits and alternatives for the proposed anesthesia with the patient or authorized representative who has indicated his/her understanding and acceptance.     Dental advisory given  Plan Discussed with: CRNA  Anesthesia Plan Comments:         Anesthesia Quick Evaluation

## 2020-03-14 NOTE — Discharge Instructions (Signed)
LAPAROSCOPIC SURGERY: POST OP INSTRUCTIONS  ######################################################################  EAT Gradually transition to a high fiber diet with a fiber supplement over the next few weeks after discharge.  Start with a pureed / full liquid diet (see below)  WALK Walk an hour a day.  Control your pain to do that.    CONTROL PAIN Control pain so that you can walk, sleep, tolerate sneezing/coughing, go up/down stairs.  HAVE A BOWEL MOVEMENT DAILY Keep your bowels regular to avoid problems.  OK to try a laxative to override constipation.  OK to use an antidairrheal to slow down diarrhea.  Call if not better after 2 tries  CALL IF YOU HAVE PROBLEMS/CONCERNS Call if you are still struggling despite following these instructions. Call if you have concerns not answered by these instructions  ######################################################################    1. DIET: Follow a light bland diet & liquids the first 24 hours after arrival home, such as soup, liquids, starches, etc.  Be sure to drink plenty of fluids.  Quickly advance to a usual solid diet within a few days.  Avoid fast food or heavy meals as your are more likely to get nauseated or have irregular bowels.  A low-sugar, high-fiber diet for the rest of your life is ideal.  2. Take your usually prescribed home medications unless otherwise directed.  3. PAIN CONTROL: a. Pain is best controlled by a usual combination of three different methods TOGETHER: i. Ice/Heat ii. Over the counter pain medication iii. Prescription pain medication b. Most patients will experience some swelling and bruising around the incisions.  Ice packs or heating pads (30-60 minutes up to 6 times a day) will help. Use ice for the first few days to help decrease swelling and bruising, then switch to heat to help relax tight/sore spots and speed recovery.  Some people prefer to use ice alone, heat alone, alternating between ice & heat.   Experiment to what works for you.  Swelling and bruising can take several weeks to resolve.   c. It is helpful to take an over-the-counter pain medication regularly for the first few days: i. Naproxen (Aleve, etc)  Two 220mg tabs twice a day OR Ibuprofen (Advil, etc) Three 200mg tabs four times a day (every meal & bedtime) AND ii. Acetaminophen (Tylenol, etc) 500-650mg four times a day (every meal & bedtime) d. A  prescription for pain medication (such as oxycodone, hydrocodone, tramadol, gabapentin, methocarbamol, etc) should be given to you upon discharge.  Take your pain medication as prescribed.  i. If you are having problems/concerns with the prescription medicine (does not control pain, nausea, vomiting, rash, itching, etc), please call us [phone] to see if we need to switch you to a different pain medicine that will work better for you and/or control your side effect better. ii. If you need a refill on your pain medication, please give us 48 hour notice.  contact your pharmacy.  They will contact our office to request authorization. Prescriptions will not be filled after 5 pm or on week-ends  4. Avoid getting constipated.   a. Between the surgery and the pain medications, it is common to experience some constipation.   b. Increasing fluid intake and taking a fiber supplement (such as Metamucil, Citrucel, FiberCon, MiraLax, etc) 1-2 times a day regularly will usually help prevent this problem from occurring.   c. A mild laxative (prune juice, Milk of Magnesia, MiraLax, etc) should be taken according to package directions if there are no bowel movements after 48   hours.   5. Watch out for diarrhea.   a. If you have many loose bowel movements, simplify your diet to bland foods & liquids for a few days.   b. Stop any stool softeners and decrease your fiber supplement.   c. Switching to mild anti-diarrheal medications (Kayopectate, Pepto Bismol) can help.   d. If this worsens or does not  improve, please call us.  6. Wash / shower every day.  You may shower over the skin glue which is waterproof.  Continue to shower over incision(s) after the dressing is off.  7. Glue will flake off after about 2 weeks.  You may leave the incision open to air.  You may replace a dressing/Band-Aid to cover the incision for comfort if you wish.   8. ACTIVITIES as tolerated:   a. You may resume regular (light) daily activities beginning the next day--such as daily self-care, walking, climbing stairs--gradually increasing activities as tolerated.  If you can walk 30 minutes without difficulty, it is safe to try more intense activity such as jogging, treadmill, bicycling, low-impact aerobics, swimming, etc. b. Save the most intensive and strenuous activity for last such as sit-ups, heavy lifting, contact sports, etc  Refrain from any heavy lifting or straining until you are off narcotics for pain control.   c. DO NOT PUSH THROUGH PAIN.  Let pain be your guide: If it hurts to do something, don't do it.  Pain is your body warning you to avoid that activity for another week until the pain goes down. d. You may drive when you are no longer taking prescription pain medication, you can comfortably wear a seatbelt, and you can safely maneuver your car and apply brakes. e. You may have sexual intercourse when it is comfortable.  9. FOLLOW UP in our office a. Please call [facility] at [phone] to set up an appointment to see your surgeon in the office for a follow-up appointment approximately 2-3 weeks after your surgery. b. Make sure that you call for this appointment the day you arrive home to insure a convenient appointment time.  10. IF YOU HAVE DISABILITY OR FAMILY LEAVE FORMS, BRING THEM TO THE OFFICE FOR PROCESSING.  DO NOT GIVE THEM TO YOUR DOCTOR.   WHEN TO CALL US [phone]: 1. Poor pain control 2. Reactions / problems with new medications (rash/itching, nausea, etc)  3. Fever over 101.5 F  (38.5 C) 4. Inability to urinate 5. Nausea and/or vomiting 6. Worsening swelling or bruising 7. Continued bleeding from incision. 8. Increased pain, redness, or drainage from the incision   The clinic staff is available to answer your questions during regular business hours (8:30am-5pm).  Please don't hesitate to call and ask to speak to one of our nurses for clinical concerns.   If you have a medical emergency, go to the nearest emergency room or call 911.  A surgeon from [facility] is always on call at the hospitals   [facility] [address], [address], [place], [place]  [address] ? MAIN: [phone] ? TOLL FREE: [phone] ?  FAX [phone] [email or web address]   

## 2020-03-15 LAB — SURGICAL PATHOLOGY

## 2020-07-04 LAB — OB RESULTS CONSOLE GC/CHLAMYDIA
Chlamydia: NEGATIVE
Gonorrhea: NEGATIVE

## 2020-07-04 LAB — OB RESULTS CONSOLE HEPATITIS B SURFACE ANTIGEN: Hepatitis B Surface Ag: NEGATIVE

## 2020-07-04 LAB — OB RESULTS CONSOLE HIV ANTIBODY (ROUTINE TESTING): HIV: NONREACTIVE

## 2020-07-04 LAB — OB RESULTS CONSOLE RUBELLA ANTIBODY, IGM: Rubella: IMMUNE

## 2020-08-31 IMAGING — US US OB < 14 WEEKS - US OB TV
1 series · 15 of 28 positions shown · non-contrast
Comparison: No prior.

CLINICAL DATA: Pregnancy.  Vaginal bleeding.

EXAM:
OBSTETRIC <14 WK US AND TRANSVAGINAL OB US
TECHNIQUE: Both transabdominal and transvaginal ultrasound examinations were
performed for complete evaluation of the gestation as well as the
maternal uterus, adnexal regions, and pelvic cul-de-sac.
Transvaginal technique was performed to assess early pregnancy.

[Series 1: us ob < 14 weeks - us ob tv · 15 of 69 slices shown]
[im 1/69]
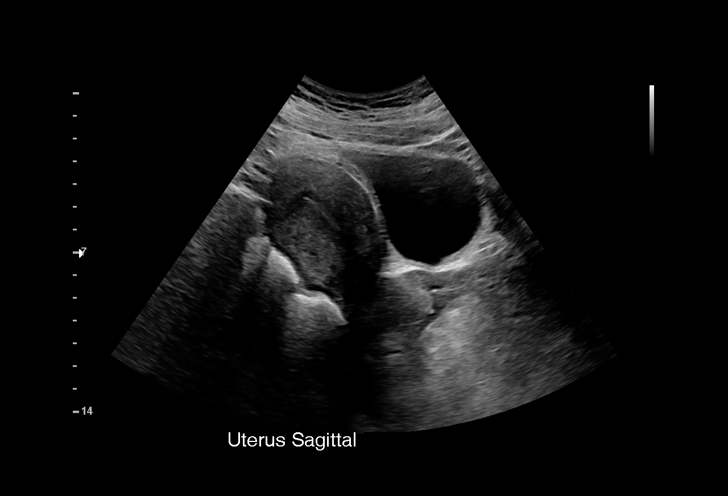
[im 6/69]
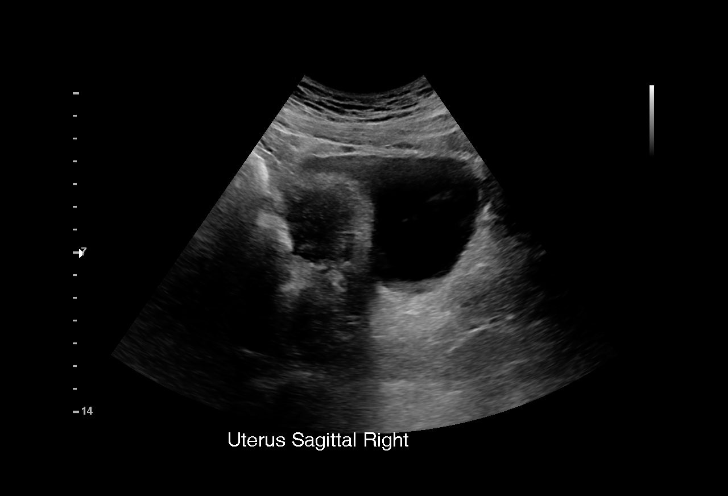
[im 11/69]
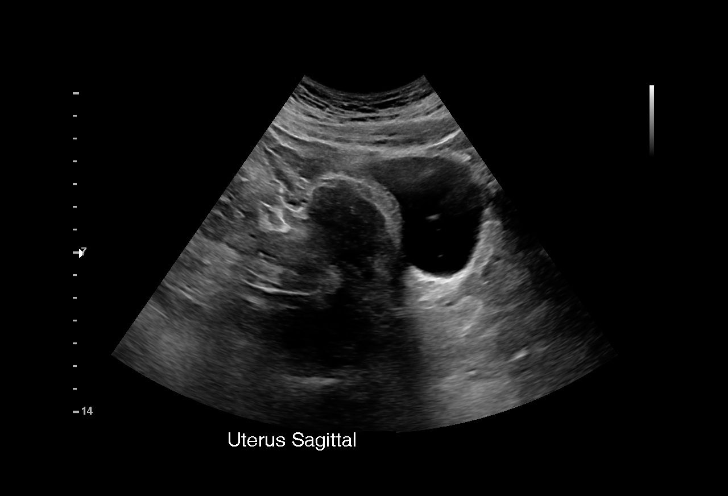
[im 16/69]
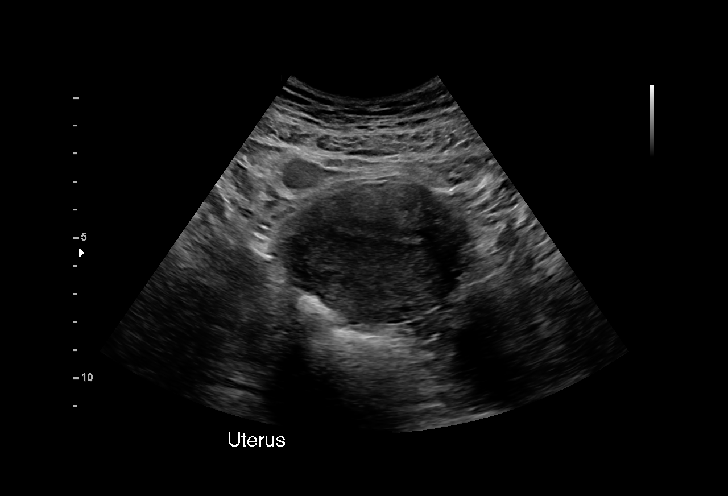
[im 21/69]
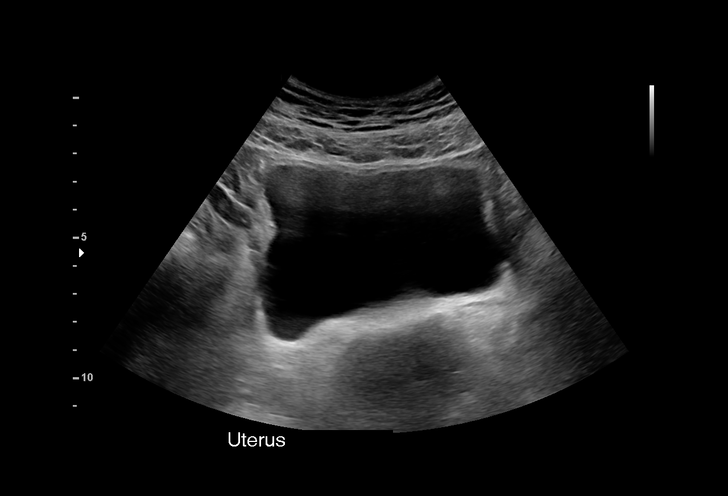
[im 26/69]
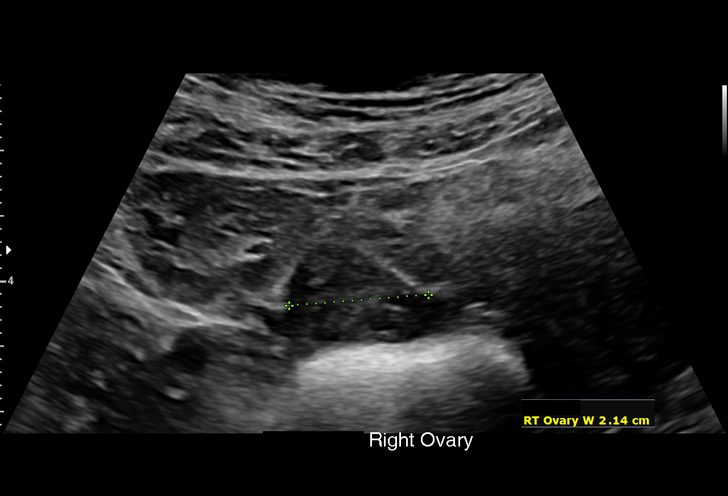
[im 31/69]
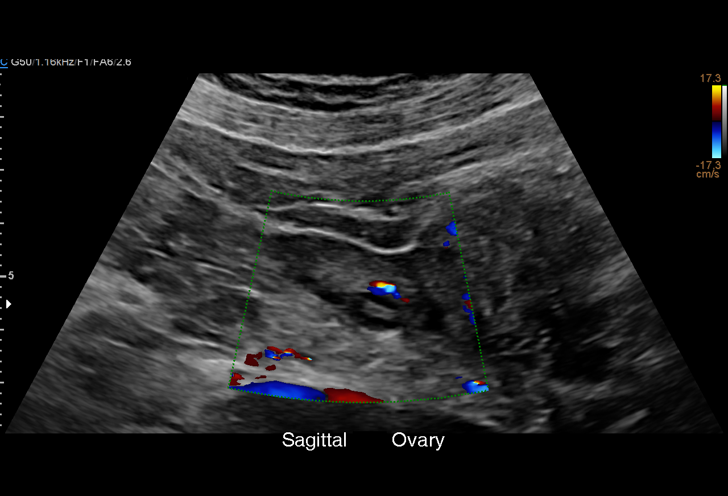
[im 36/69]
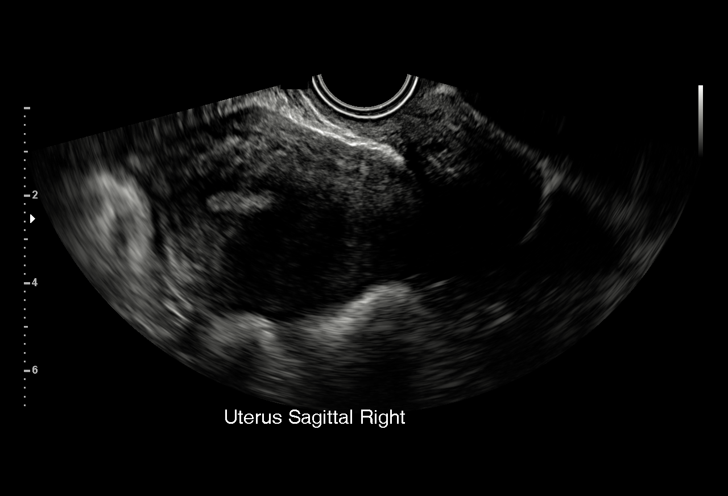
[im 38/69]
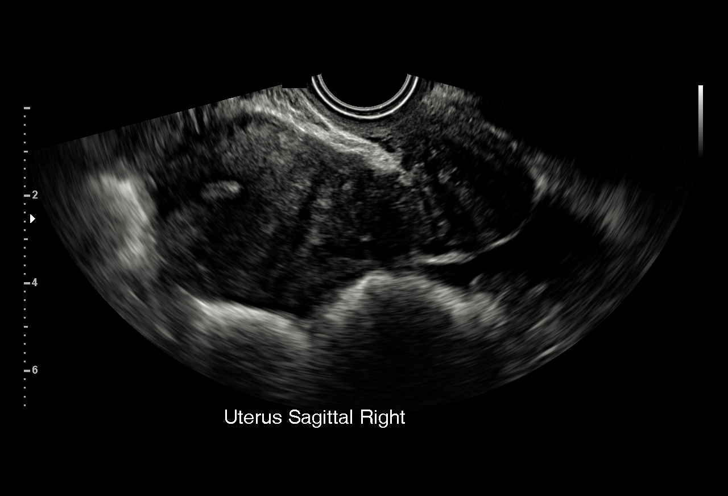
[im 43/69]
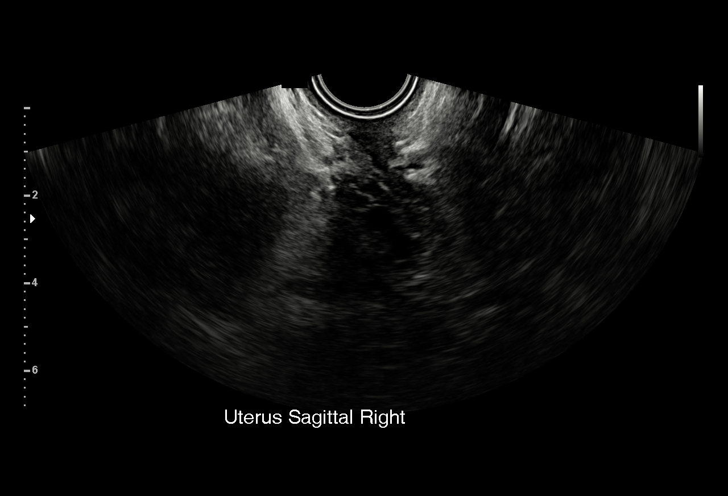
[im 48/69]
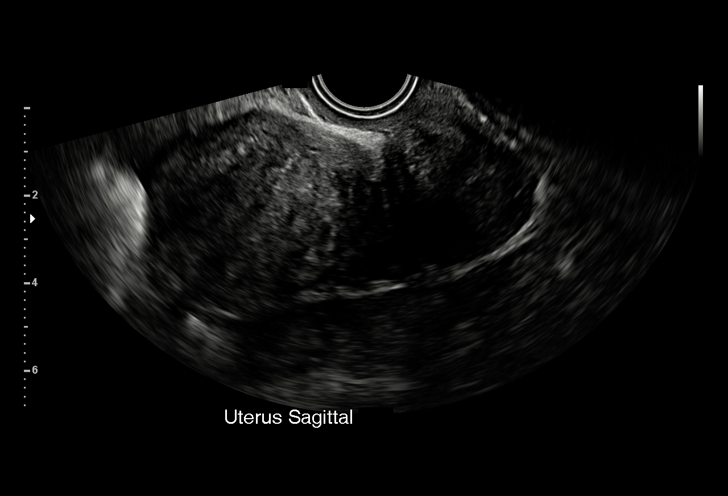
[im 53/69]
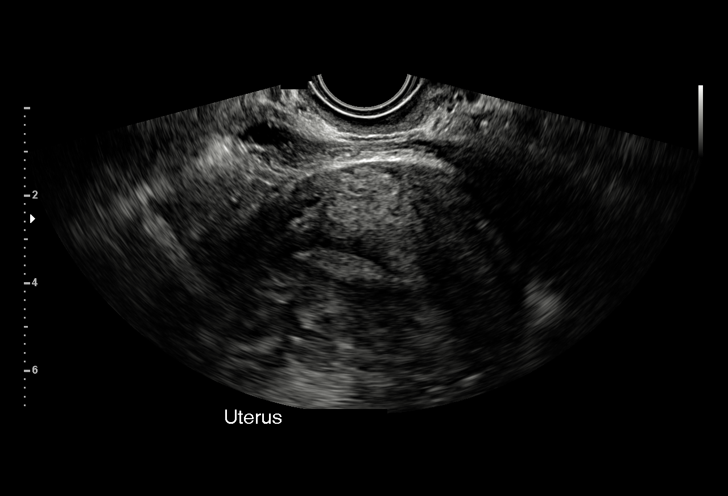
[im 58/69]
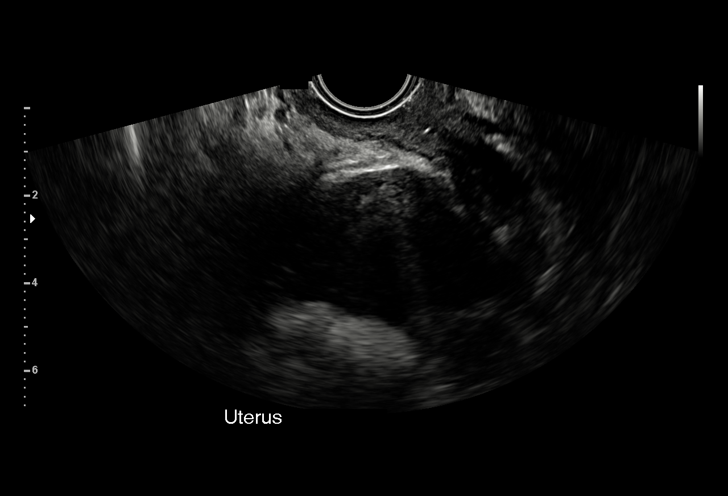
[im 63/69]
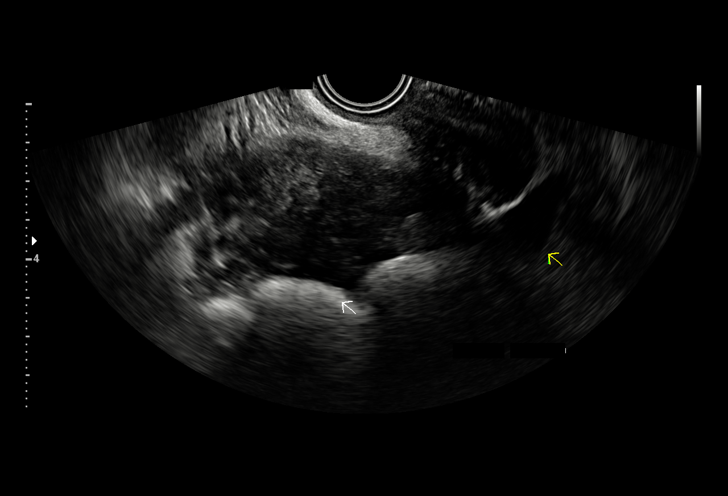
[im 69/69]
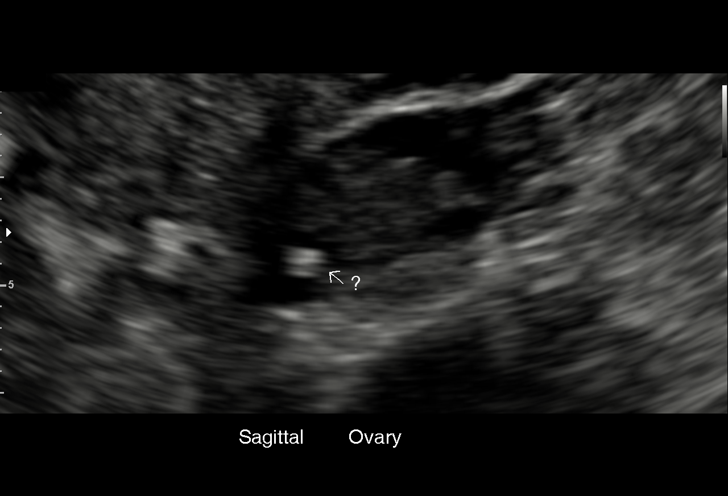

[15 of 28 positions shown; findings below may reference images not displayed]

FINDINGS: Intrauterine gestational sac: Not visualized

Yolk sac:  None visualized

Embryo:  None visualized

Cardiac Activity: None visualized

Heart Rate:   bpm

Subchorionic hemorrhage:  None visualized.

Maternal uterus/adnexae: Questionable tiny hyperechoic focus in the
left ovary. Small amount of free pelvic fluid.
IMPRESSION: 1.  No intrauterine pregnancy identified.

2. Questionable tiny hyperechoic focus in the left ovary. This is of
doubtful clinical significance, follow-up exam in 1 month suggested
to demonstrate stability or resolution. Follow-up beta hCGs
suggested to exclude ectopic pregnancy. Small amount of free pelvic
fluid noted.

## 2020-11-01 LAB — OB RESULTS CONSOLE GBS: GBS: POSITIVE

## 2020-11-26 ENCOUNTER — Other Ambulatory Visit: Payer: Self-pay

## 2020-11-26 ENCOUNTER — Inpatient Hospital Stay (HOSPITAL_COMMUNITY)
Admission: AD | Admit: 2020-11-26 | Discharge: 2020-11-26 | Disposition: A | Discharge status: Still In Hospital | Payer: Managed Care, Other (non HMO) | Attending: Obstetrics and Gynecology | Admitting: Obstetrics and Gynecology

## 2020-11-26 ENCOUNTER — Inpatient Hospital Stay (HOSPITAL_COMMUNITY)
Admission: AD | Admit: 2020-11-26 | Discharge: 2020-11-27 | Disposition: A | Discharge status: Home / Self Care | Payer: Managed Care, Other (non HMO) | Attending: Obstetrics and Gynecology | Admitting: Obstetrics and Gynecology

## 2020-11-26 DIAGNOSIS — O99891 Other specified diseases and conditions complicating pregnancy: Secondary | ICD-10-CM | POA: Diagnosis not present

## 2020-11-26 DIAGNOSIS — Z87891 Personal history of nicotine dependence: Secondary | ICD-10-CM | POA: Insufficient documentation

## 2020-11-26 DIAGNOSIS — O99413 Diseases of the circulatory system complicating pregnancy, third trimester: Secondary | ICD-10-CM | POA: Insufficient documentation

## 2020-11-26 DIAGNOSIS — R079 Chest pain, unspecified: Secondary | ICD-10-CM | POA: Diagnosis not present

## 2020-11-26 DIAGNOSIS — R0602 Shortness of breath: Secondary | ICD-10-CM | POA: Diagnosis not present

## 2020-11-26 DIAGNOSIS — Z3689 Encounter for other specified antenatal screening: Secondary | ICD-10-CM

## 2020-11-26 DIAGNOSIS — Z3A39 39 weeks gestation of pregnancy: Secondary | ICD-10-CM | POA: Diagnosis not present

## 2020-11-26 DIAGNOSIS — R000 Tachycardia, unspecified: Secondary | ICD-10-CM | POA: Diagnosis not present

## 2020-11-26 LAB — URINALYSIS, ROUTINE W REFLEX MICROSCOPIC
Bilirubin Urine: NEGATIVE
Glucose, UA: NEGATIVE mg/dL
Hgb urine dipstick: NEGATIVE
Ketones, ur: NEGATIVE mg/dL
Leukocytes,Ua: NEGATIVE
Nitrite: NEGATIVE
Protein, ur: NEGATIVE mg/dL
Specific Gravity, Urine: 1.005 (ref 1.005–1.030)
pH: 7 (ref 5.0–8.0)

## 2020-11-26 LAB — CBC
HCT: 34.6 % — ABNORMAL LOW (ref 36.0–46.0)
Hemoglobin: 11.5 g/dL — ABNORMAL LOW (ref 12.0–15.0)
MCH: 28.8 pg (ref 26.0–34.0)
MCHC: 33.2 g/dL (ref 30.0–36.0)
MCV: 86.7 fL (ref 80.0–100.0)
Platelets: 229 10*3/uL (ref 150–400)
RBC: 3.99 MIL/uL (ref 3.87–5.11)
RDW: 13.5 % (ref 11.5–15.5)
WBC: 10.6 10*3/uL — ABNORMAL HIGH (ref 4.0–10.5)
nRBC: 0 % (ref 0.0–0.2)

## 2020-11-26 LAB — COMPREHENSIVE METABOLIC PANEL
ALT: 18 U/L (ref 0–44)
AST: 17 U/L (ref 15–41)
Albumin: 2.8 g/dL — ABNORMAL LOW (ref 3.5–5.0)
Alkaline Phosphatase: 80 U/L (ref 38–126)
Anion gap: 10 (ref 5–15)
BUN: 6 mg/dL (ref 6–20)
CO2: 23 mmol/L (ref 22–32)
Calcium: 9.1 mg/dL (ref 8.9–10.3)
Chloride: 103 mmol/L (ref 98–111)
Creatinine, Ser: 0.53 mg/dL (ref 0.44–1.00)
GFR, Estimated: >60 mL/min (ref >60)
Glucose, Bld: 95 mg/dL (ref 70–99)
Potassium: 3.6 mmol/L (ref 3.5–5.1)
Sodium: 136 mmol/L (ref 135–145)
Total Bilirubin: 0.4 mg/dL (ref 0.3–1.2)
Total Protein: 6.2 g/dL — ABNORMAL LOW (ref 6.5–8.1)

## 2020-11-26 NOTE — MAU Provider Note (Signed)
History     CSN: [id number]  Arrival date and time: 11/26/20 2011   First Provider Initiated Contact with Patient 11/26/20 2132      Chief Complaint  Patient presents with  . Chest Pain  . Shortness of Breath   Stephanie Wilkinson is a 29 y.o. G2P1001 at [redacted]w[redacted]d who receives care at [facility].  She presents today for Chest Tightness and Shortness of Breath.  She states her symptoms started yesterday around 5pm while leaving [employer].  She reports it feels like her heart is skipping a beat.  She reports it was not so bad yesterday, but today it has increased in frequency.  She states she has moments where it feels like "someone has punched my heart." She states that she was also having some difficulty with breathing.  She reports taking a baby aspirin at 7am and the symptoms "eased up for the rest of the day." She reports taking half of a baby aspirin at 1930 and the symptoms remained the same. She states that when she burps this causes some relief, but the symptoms soon return.  Patient reports a history of CHTN, but has not had any issues during her pregnancy of current or past.   Patient reports that in the 10th grade she was told she had a premature valve, but nothing ever came of this.    OB History    Gravida  2   Para  1   Term  1   Preterm      AB      Living  1     SAB      TAB      Ectopic      Multiple  0   Live Births  1           Past Medical History:  Diagnosis Date  . Medical history non-contributory     Past Surgical History:  Procedure Laterality Date  . NO PAST SURGERIES      No family history on file.  Social History   Tobacco Use  . Smoking status: Former Smoker    Quit date: 08/24/2012    Years since quitting: 8.2  . Smokeless tobacco: Never Used  Vaping Use  . Vaping Use: Never used  Substance Use Topics  . Alcohol use: Yes    Alcohol/week: 1.0 standard drink    Types: 1 Standard drinks or equivalent per week  . Drug  use: No    Allergies: No Known Allergies  Medications Prior to Admission  Medication Sig Dispense Refill Last Dose  . famotidine (PEPCID) 20 MG tablet Take 20 mg by mouth daily.   11/25/2020 at Unknown time  . meclizine (ANTIVERT) 25 MG tablet Take 25 mg by mouth 3 (three) times daily as needed (vertigo).   Past Month at Unknown time  . Multiple Vitamin (MULTIVITAMIN WITH MINERALS) TABS tablet Take 1 tablet by mouth daily. Women's Multivitamin   11/26/2020 at Unknown time  . Omega-3 Fatty Acids (FISH OIL) 1200 MG CAPS Take 1,200 mg by mouth daily.   11/26/2020 at Unknown time  . vitamin B-12 (CYANOCOBALAMIN) 1000 MCG tablet Take 1,000 mcg by mouth daily.   11/26/2020 at Unknown time  . ondansetron (ZOFRAN ODT) 4 MG disintegrating tablet Take 1 tablet (4 mg total) by mouth every 8 (eight) hours as needed for nausea or vomiting. 20 tablet 0 Unknown at Unknown time  . ondansetron (ZOFRAN) 4 MG tablet Take 1 tablet (4 mg total)  by mouth every 8 (eight) hours as needed for nausea or vomiting. (Patient not taking: Reported on 03/01/2020) 20 tablet 0 Unknown at Unknown time  . traMADol (ULTRAM) 50 MG tablet Take 1 tablet (50 mg total) by mouth every 6 (six) hours as needed. 15 tablet 0     Review of Systems  Constitutional: Negative for chills and fever.  Eyes: Negative for visual disturbance.  Respiratory: Positive for chest tightness and shortness of breath. Negative for cough.   Cardiovascular: Positive for chest pain (None Currently).  Gastrointestinal: Negative for abdominal pain, nausea and vomiting.  Genitourinary: Negative for difficulty urinating, dysuria, vaginal bleeding and vaginal discharge.  Neurological: Positive for headaches (6/10). Negative for dizziness and light-headedness.   Physical Exam   Blood pressure 130/78, pulse (!) 111, temperature 98.7 F (37.1 C), temperature source Oral, resp. rate 18, height 5' 10.5" (1.791 m), weight 132 kg, last menstrual period 02/23/2020, SpO2 99  %, unknown if currently breastfeeding.  Physical Exam Constitutional:      General: She is not in acute distress.    Appearance: She is well-developed. She is obese. She is not ill-appearing.  HENT:     Head: Normocephalic and atraumatic.  Eyes:     Conjunctiva/sclera: Conjunctivae normal.  Cardiovascular:     Rate and Rhythm: Regular rhythm. Tachycardia present.     Heart sounds: Normal heart sounds. Heart sounds not distant.  Pulmonary:     Effort: Pulmonary effort is normal. No accessory muscle usage or respiratory distress.     Breath sounds: Normal breath sounds. No decreased breath sounds.  Chest:     Chest wall: No tenderness.  Abdominal:     Tenderness: There is no abdominal tenderness.  Musculoskeletal:        General: Normal range of motion.     Cervical back: Normal range of motion.     Right lower leg: No tenderness. Edema present.     Left lower leg: No tenderness. Edema present.  Skin:    General: Skin is warm and dry.  Neurological:     Mental Status: She is alert and oriented to person, place, and time.  Psychiatric:        Mood and Affect: Mood normal.        Behavior: Behavior normal.        Thought Content: Thought content normal.     Fetal Assessment 145 bpm, Mod Var, +Occ Variable Decels, +Accels Toco: None  MAU Course   Results for orders placed or performed during the hospital encounter of 11/26/20 (from the past 24 hour(s))  Urinalysis, Routine w reflex microscopic     Status: Abnormal   Collection Time: 11/26/20  8:45 PM  Result Value Ref Range   Color, Urine STRAW (A) YELLOW   APPearance CLEAR CLEAR   Specific Gravity, Urine 1.005 1.005 - 1.030   pH 7.0 5.0 - 8.0   Glucose, UA NEGATIVE NEGATIVE mg/dL   Hgb urine dipstick NEGATIVE NEGATIVE   Bilirubin Urine NEGATIVE NEGATIVE   Ketones, ur NEGATIVE NEGATIVE mg/dL   Protein, ur NEGATIVE NEGATIVE mg/dL   Nitrite NEGATIVE NEGATIVE   Leukocytes,Ua NEGATIVE NEGATIVE  CBC     Status: Abnormal    Collection Time: 11/26/20 10:54 PM  Result Value Ref Range   WBC 10.6 (H) 4.0 - 10.5 K/uL   RBC 3.99 3.87 - 5.11 MIL/uL   Hemoglobin 11.5 (L) 12.0 - 15.0 g/dL   HCT 71.0 (L) 36 - 46 %   MCV 86.7  80.0 - 100.0 fL   MCH 28.8 26.0 - 34.0 pg   MCHC 33.2 30.0 - 36.0 g/dL   RDW 80.0 34.9 - 17.9 %   Platelets 229 150 - 400 K/uL   nRBC 0.0 0.0 - 0.2 %  Comprehensive metabolic panel     Status: Abnormal   Collection Time: 11/26/20 10:54 PM  Result Value Ref Range   Sodium 136 135 - 145 mmol/L   Potassium 3.6 3.5 - 5.1 mmol/L   Chloride 103 98 - 111 mmol/L   CO2 23 22 - 32 mmol/L   Glucose, Bld 95 70 - 99 mg/dL   BUN 6 6 - 20 mg/dL   Creatinine, Ser 1.50 0.44 - 1.00 mg/dL   Calcium 9.1 8.9 - 56.9 mg/dL   Total Protein 6.2 (L) 6.5 - 8.1 g/dL   Albumin 2.8 (L) 3.5 - 5.0 g/dL   AST 17 15 - 41 U/L   ALT 18 0 - 44 U/L   Alkaline Phosphatase 80 38 - 126 U/L   Total Bilirubin 0.4 0.3 - 1.2 mg/dL   GFR, Estimated >79 >48 mL/min   Anion gap 10 5 - 15  Protein / creatinine ratio, urine     Status: None   Collection Time: 11/26/20 11:41 PM  Result Value Ref Range   Creatinine, Urine 43.83 mg/dL   Total Protein, Urine <6 mg/dL   Protein Creatinine Ratio        0.00 - 0.15 mg/mg[Cre]   No results found.  MDM Physical Exam Labs: CBC, CMP, PC Ratio Measure BPQ15 min EFM Antacid EKG Assessment and Plan  29 year old G2P1001  SIUP at 39.4weeks Cat I FT SOB Tachycardia  -EKG performed upon arrival. -POC Reviewed -Exam performed and findings discussed. -Patient informed that symptoms are suspicious for GERD. -Will call cardiology and review EKG. -Will collect PIH labs d/t history of CHTN and slightly elevated blood pressures.  -NST reactive.  Okay to discontinue monitor.   Cherre Robins MSN, CNM 11/26/2020, 9:32 PM   Reassessment (10:40 PM) -Dr. Early Osmond paged for consult.  Reports some tachycardia, but otherwise normal EKG. Thanked for  his consult. -Patient updated on EKG  results. -Informed that labs are still pending. -Will continue to monitor  Reassessment (00:38 AM) -Labs return normal. -Informed that MD would be consulted for review of care prior to discharge. -Patient reports symptoms have resolved with burping! -Dr. Mitzi Hansen contacted and unavailable d/t OR obligations. -Patient updated and states that she feels better and would like to discharge home. -Informed that provider would still contact MD and if recommendation for further intervention, provider would contact patient primary ob office. -Patient agreeable.  Patient reports "heartburn" and agreeable to tums. -Reviewed medications for discharge and will increase Famotidine to 20mg  BID. -Encouraged to call or return to MAU if symptoms worsen or with the onset of new symptoms. -Discharged to home in improved condition.  Reassessment (1:37 AM) -Dr. contacted and informed of patient status, interventions, and discharge.  No additional interventions or orders given.  Agrees with plan.  Mitzi Hansen MSN, CNM Advanced Practice Provider, [facility]

## 2020-11-26 NOTE — MAU Note (Addendum)
..  Stephanie Wilkinson is a 29 y.o. at [redacted]w[redacted]d here in MAU reporting: heart skipping beat. "feels like someone is squeezing my heart" Reports shortness of breath. Denies every having this feeling.   Denies CTX, VB, or LOF. +FM  Pain score: 8/10 Vitals:   11/26/20 2016  BP: 130/78  Pulse: (!) 111  Resp: 18  Temp: 98.7 F (37.1 C)  SpO2: 99%     FHT: 150

## 2020-11-27 LAB — PROTEIN / CREATININE RATIO, URINE
Creatinine, Urine: 43.83 mg/dL
Total Protein, Urine: <6 mg/dL

## 2020-11-27 MED ORDER — CALCIUM CARBONATE ANTACID 500 MG PO CHEW
400.0000 mg | CHEWABLE_TABLET | Freq: Once | ORAL | Status: AC
  Administered 2020-11-27: 400 mg via ORAL
  Filled 2020-11-27: qty 2

## 2020-11-27 MED ORDER — FAMOTIDINE 20 MG PO TABS: 20.0000 mg | ORAL_TABLET | Freq: Two times a day (BID) | ORAL | 1 refills | Status: AC

## 2020-11-27 NOTE — Discharge Instructions (Signed)
Gastroesophageal Reflux Disease, Adult Gastroesophageal reflux (GER) happens when acid from the stomach flows up into the tube that connects the mouth and the stomach (esophagus). Normally, food travels down the esophagus and stays in the stomach to be digested. However, when a person has GER, food and stomach acid sometimes move back up into the esophagus. If this becomes a more serious problem, the person may be diagnosed with a disease called gastroesophageal reflux disease (GERD). GERD occurs when the reflux:  Happens often.  Causes frequent or severe symptoms.  Causes problems such as damage to the esophagus. When stomach acid comes in contact with the esophagus, the acid may cause soreness (inflammation) in the esophagus. Over time, GERD may create small holes (ulcers) in the lining of the esophagus. What are the causes? This condition is caused by a problem with the muscle between the esophagus and the stomach (lower esophageal sphincter, or LES). Normally, the LES muscle closes after food passes through the esophagus to the stomach. When the LES is weakened or abnormal, it does not close properly, and that allows food and stomach acid to go back up into the esophagus. The LES can be weakened by certain dietary substances, medicines, and medical conditions, including:  Tobacco use.  Pregnancy.  Having a hiatal hernia.  Alcohol use.  Certain foods and beverages, such as coffee, chocolate, onions, and peppermint. What increases the risk? You are more likely to develop this condition if you:  Have an increased body weight.  Have a connective tissue disorder.  Use NSAID medicines. What are the signs or symptoms? Symptoms of this condition include:  Heartburn.  Difficult or painful swallowing.  The feeling of having a lump in the throat.  Abitter taste in the mouth.  Bad breath.  Having a large amount of saliva.  Having an upset or bloated  stomach.  Belching.  Chest pain. Different conditions can cause chest pain. Make sure you see your health care provider if you experience chest pain.  Shortness of breath or wheezing.  Ongoing (chronic) cough or a night-time cough.  Wearing away of tooth enamel.  Weight loss. How is this diagnosed? Your health care provider will take a medical history and perform a physical exam. To determine if you have mild or severe GERD, your health care provider may also monitor how you respond to treatment. You may also have tests, including:  A test to examine your stomach and esophagus with a small camera (endoscopy).  A test thatmeasures the acidity level in your esophagus.  A test thatmeasures how much pressure is on your esophagus.  A barium swallow or modified barium swallow test to show the shape, size, and functioning of your esophagus. How is this treated? The goal of treatment is to help relieve your symptoms and to prevent complications. Treatment for this condition may vary depending on how severe your symptoms are. Your health care provider may recommend:  Changes to your diet.  Medicine.  Surgery. Follow these instructions at home: Eating and drinking   Follow a diet as recommended by your health care provider. This may involve avoiding foods and drinks such as: ? Coffee and tea (with or without caffeine). ? Drinks that containalcohol. ? Energy drinks and sports drinks. ? Carbonated drinks or sodas. ? Chocolate and cocoa. ? Peppermint and mint flavorings. ? Garlic and onions. ? Horseradish. ? Spicy and acidic foods, including peppers, chili powder, curry powder, vinegar, hot sauces, and barbecue sauce. ? Citrus fruit juices and citrus   fruits, such as oranges, lemons, and limes. ? Tomato-based foods, such as red sauce, chili, salsa, and pizza with red sauce. ? Fried and fatty foods, such as donuts, french fries, potato chips, and high-fat dressings. ? High-fat  meats, such as hot dogs and fatty cuts of red and white meats, such as rib eye steak, sausage, ham, and bacon. ? High-fat dairy items, such as whole milk, butter, and cream cheese.  Eat small, frequent meals instead of large meals.  Avoid drinking large amounts of liquid with your meals.  Avoid eating meals during the 2-3 hours before bedtime.  Avoid lying down right after you eat.  Do not exercise right after you eat. Lifestyle   Do not use any products that contain nicotine or tobacco, such as cigarettes, e-cigarettes, and chewing tobacco. If you need help quitting, ask your health care provider.  Try to reduce your stress by using methods such as yoga or meditation. If you need help reducing stress, ask your health care provider.  If you are overweight, reduce your weight to an amount that is healthy for you. Ask your health care provider for guidance about a safe weight loss goal. General instructions  Pay attention to any changes in your symptoms.  Take over-the-counter and prescription medicines only as told by your health care provider. Do not take aspirin, ibuprofen, or other NSAIDs unless your health care provider told you to do so.  Wear loose-fitting clothing. Do not wear anything tight around your waist that causes pressure on your abdomen.  Raise (elevate) the head of your bed about 6 inches (15 cm).  Avoid bending over if this makes your symptoms worse.  Keep all follow-up visits as told by your health care provider. This is important. Contact a health care provider if:  You have: ? New symptoms. ? Unexplained weight loss. ? Difficulty swallowing or it hurts to swallow. ? Wheezing or a persistent cough. ? A hoarse voice.  Your symptoms do not improve with treatment. Get help right away if you:  Have pain in your arms, neck, jaw, teeth, or back.  Feel sweaty, dizzy, or light-headed.  Have chest pain or shortness of breath.  Vomit and your vomit looks  like blood or coffee grounds.  Faint.  Have stool that is bloody or black.  Cannot swallow, drink, or eat. Summary  Gastroesophageal reflux happens when acid from the stomach flows up into the esophagus. GERD is a disease in which the reflux happens often, causes frequent or severe symptoms, or causes problems such as damage to the esophagus.  Treatment for this condition may vary depending on how severe your symptoms are. Your health care provider may recommend diet and lifestyle changes, medicine, or surgery.  Contact a health care provider if you have new or worsening symptoms.  Take over-the-counter and prescription medicines only as told by your health care provider. Do not take aspirin, ibuprofen, or other NSAIDs unless your health care provider told you to do so.  Keep all follow-up visits as told by your health care provider. This is important. This information is not intended to replace advice given to you by your health care provider. Make sure you discuss any questions you have with your health care provider. Document Revised: 06/18/2018 Document Reviewed: 06/18/2018 Elsevier Patient Education  2020 [employer] Safe Medications in Pregnancy   Acne: Benzoyl Peroxide Salicylic Acid  Backache/Headache: Tylenol: 2 regular strength every 4 hours OR  2 Extra strength every 6 hours  Colds/Coughs/Allergies: Benadryl (alcohol free) 25 mg every 6 hours as needed Breath right strips Claritin Cepacol throat lozenges Chloraseptic throat spray Cold-Eeze- up to three times per day Cough drops, alcohol free Flonase (by prescription only) Guaifenesin Mucinex Robitussin DM (plain only, alcohol free) Saline nasal spray/drops Sudafed (pseudoephedrine) & Actifed ** use only after [redacted] weeks gestation and if you do not have high blood pressure Tylenol Vicks Vaporub Zinc lozenges Zyrtec   Constipation: Colace Ducolax suppositories Fleet enema Glycerin  suppositories Metamucil Milk of magnesia Miralax Senokot Smooth move tea  Diarrhea: Kaopectate Imodium A-D  *NO pepto Bismol  Hemorrhoids: Anusol Anusol HC Preparation H Tucks  Indigestion: Tums Maalox Mylanta Zantac  Pepcid  Insomnia: Benadryl (alcohol free) 25mg  every 6 hours as needed Tylenol PM Unisom, no Gelcaps  Leg Cramps: Tums MagGel  Nausea/Vomiting:  Bonine Dramamine Emetrol Ginger extract Sea bands Meclizine  Nausea medication to take during pregnancy:  Unisom (doxylamine succinate 25 mg tablets) Take one tablet daily at bedtime. If symptoms are not adequately controlled, the dose can be increased to a maximum recommended dose of two tablets daily (1/2 tablet in the morning, 1/2 tablet mid-afternoon and one at bedtime). Vitamin B6 100mg  tablets. Take one tablet twice a day (up to 200 mg per day).  Skin Rashes: Aveeno products Benadryl cream or 25mg  every 6 hours as needed Calamine Lotion 1% cortisone cream  Yeast infection: Gyne-lotrimin 7 Monistat 7  Gum/tooth pain: Anbesol  **If taking multiple medications, please check labels to avoid duplicating the same active ingredients **take medication as directed on the label ** Do not exceed 4000 mg of tylenol in 24 hours **Do not take medications that contain aspirin or ibuprofen

## 2020-12-24 NOTE — L&D Delivery Note (Signed)
Delivery Note At 4:00 PM a viable and healthy female was delivered via Vaginal, Spontaneous (Presentation: Left Occiput Anterior).  APGAR: 8, 9; weight pending .   Placenta status: Spontaneous, Intact.  Cord: 3 vessels with the following complications: None.    The patient pushed for approximately 15 minutes. The delivery was attended by the faculty practice resident due to me not receiving the delivery call. I noticed the FHR was no longer tracing and came to evaluate and arrived about 1 minute after the delivery. The infant was crying vigorously on the maternal abdomen. The cord was clamped and cut after approximately 2 minute delay. The placenta delivered spontaneously, intact, with 3 vessel cord. A 3rd degree laceration was noted an repaired with 0 vicryl interrupted sutures. A rectal exam confirmed the external sphincter muscle was intact. The remaining 2nd degree was repaired with 2-0 vicyl  Anesthesia: Epidural Episiotomy: None Lacerations: 2nd degree;3rd degree Suture Repair: 0 vicryl for the 3rd degree repair, and 3-0 vicryl for the 2nd degree laceration Est. Blood Loss (mL):  584 cc  Mom to postpartum.  Baby to Couplet care / Skin to Skin.  Waynard Reeds 01/31/2021, 4:44 PM

## 2021-01-26 ENCOUNTER — Telehealth (HOSPITAL_COMMUNITY): Payer: Self-pay | Admitting: *Deleted

## 2021-01-26 NOTE — Telephone Encounter (Signed)
Preadmission screen  

## 2021-01-27 ENCOUNTER — Telehealth (HOSPITAL_COMMUNITY): Payer: Self-pay | Admitting: *Deleted

## 2021-01-27 ENCOUNTER — Encounter (HOSPITAL_COMMUNITY): Payer: Self-pay | Admitting: *Deleted

## 2021-01-27 NOTE — Telephone Encounter (Signed)
Preadmission screen  

## 2021-01-28 ENCOUNTER — Other Ambulatory Visit (HOSPITAL_COMMUNITY)
Admission: RE | Admit: 2021-01-28 | Discharge: 2021-01-28 | Disposition: A | Discharge status: Home / Self Care | Payer: Managed Care, Other (non HMO) | Source: Ambulatory Visit | Attending: Obstetrics | Admitting: Obstetrics

## 2021-01-28 DIAGNOSIS — Z01812 Encounter for preprocedural laboratory examination: Secondary | ICD-10-CM | POA: Insufficient documentation

## 2021-01-28 DIAGNOSIS — Z20822 Contact with and (suspected) exposure to covid-19: Secondary | ICD-10-CM | POA: Insufficient documentation

## 2021-01-28 LAB — SARS CORONAVIRUS 2 (TAT 6-24 HRS): SARS Coronavirus 2: NEGATIVE

## 2021-01-31 ENCOUNTER — Inpatient Hospital Stay (HOSPITAL_COMMUNITY)
Admission: AD | Admit: 2021-01-31 | Discharge: 2021-02-01 | DRG: 768 | Disposition: A | Discharge status: Home / Self Care | Payer: Managed Care, Other (non HMO) | Attending: Obstetrics and Gynecology | Admitting: Obstetrics and Gynecology

## 2021-01-31 ENCOUNTER — Inpatient Hospital Stay (HOSPITAL_COMMUNITY): Payer: Managed Care, Other (non HMO) | Admitting: Anesthesiology

## 2021-01-31 ENCOUNTER — Inpatient Hospital Stay (HOSPITAL_COMMUNITY): Payer: Managed Care, Other (non HMO)

## 2021-01-31 ENCOUNTER — Other Ambulatory Visit: Payer: Self-pay

## 2021-01-31 ENCOUNTER — Encounter (HOSPITAL_COMMUNITY): Payer: Self-pay | Admitting: Obstetrics

## 2021-01-31 DIAGNOSIS — O99824 Streptococcus B carrier state complicating childbirth: Secondary | ICD-10-CM | POA: Diagnosis present

## 2021-01-31 DIAGNOSIS — Z87891 Personal history of nicotine dependence: Secondary | ICD-10-CM | POA: Diagnosis not present

## 2021-01-31 DIAGNOSIS — Z20822 Contact with and (suspected) exposure to covid-19: Secondary | ICD-10-CM | POA: Diagnosis present

## 2021-01-31 DIAGNOSIS — Z3A39 39 weeks gestation of pregnancy: Secondary | ICD-10-CM

## 2021-01-31 DIAGNOSIS — O1002 Pre-existing essential hypertension complicating childbirth: Principal | ICD-10-CM | POA: Diagnosis present

## 2021-01-31 DIAGNOSIS — O10013 Pre-existing essential hypertension complicating pregnancy, third trimester: Secondary | ICD-10-CM | POA: Diagnosis present

## 2021-01-31 LAB — CBC
HCT: 35.1 % — ABNORMAL LOW (ref 36.0–46.0)
HCT: 36.4 % (ref 36.0–46.0)
HCT: 34.7 % — ABNORMAL LOW (ref 36.0–46.0)
Hemoglobin: 12.1 g/dL (ref 12.0–15.0)
Hemoglobin: 11.8 g/dL — ABNORMAL LOW (ref 12.0–15.0)
Hemoglobin: 11.3 g/dL — ABNORMAL LOW (ref 12.0–15.0)
MCH: 29.4 pg (ref 26.0–34.0)
MCH: 28.5 pg (ref 26.0–34.0)
MCH: 28.5 pg (ref 26.0–34.0)
MCHC: 34.5 g/dL (ref 30.0–36.0)
MCHC: 32.4 g/dL (ref 30.0–36.0)
MCHC: 32.6 g/dL (ref 30.0–36.0)
MCV: 85.4 fL (ref 80.0–100.0)
MCV: 87.9 fL (ref 80.0–100.0)
MCV: 87.4 fL (ref 80.0–100.0)
Platelets: 195 10*3/uL (ref 150–400)
Platelets: 182 10*3/uL (ref 150–400)
Platelets: 203 10*3/uL (ref 150–400)
RBC: 4.11 MIL/uL (ref 3.87–5.11)
RBC: 4.14 MIL/uL (ref 3.87–5.11)
RBC: 3.97 MIL/uL (ref 3.87–5.11)
RDW: 14.6 % (ref 11.5–15.5)
RDW: 14.8 % (ref 11.5–15.5)
RDW: 14.6 % (ref 11.5–15.5)
WBC: 11 10*3/uL — ABNORMAL HIGH (ref 4.0–10.5)
WBC: 10.3 10*3/uL (ref 4.0–10.5)
WBC: 17.5 10*3/uL — ABNORMAL HIGH (ref 4.0–10.5)
nRBC: 0 % (ref 0.0–0.2)
nRBC: 0 % (ref 0.0–0.2)
nRBC: 0 % (ref 0.0–0.2)

## 2021-01-31 LAB — RPR: RPR Ser Ql: NONREACTIVE

## 2021-01-31 LAB — COMPREHENSIVE METABOLIC PANEL
ALT: 21 U/L (ref 0–44)
AST: 21 U/L (ref 15–41)
Albumin: 2.6 g/dL — ABNORMAL LOW (ref 3.5–5.0)
Alkaline Phosphatase: 106 U/L (ref 38–126)
Anion gap: 12 (ref 5–15)
BUN: 8 mg/dL (ref 6–20)
CO2: 18 mmol/L — ABNORMAL LOW (ref 22–32)
Calcium: 9.3 mg/dL (ref 8.9–10.3)
Chloride: 106 mmol/L (ref 98–111)
Creatinine, Ser: 0.54 mg/dL (ref 0.44–1.00)
GFR, Estimated: >60 mL/min (ref >60)
Glucose, Bld: 94 mg/dL (ref 70–99)
Potassium: 3.9 mmol/L (ref 3.5–5.1)
Sodium: 136 mmol/L (ref 135–145)
Total Bilirubin: 0.4 mg/dL (ref 0.3–1.2)
Total Protein: 6.2 g/dL — ABNORMAL LOW (ref 6.5–8.1)

## 2021-01-31 LAB — TYPE AND SCREEN
ABO/RH(D): O POS
Antibody Screen: NEGATIVE

## 2021-01-31 MED ORDER — DIPHENHYDRAMINE HCL 25 MG PO CAPS
25.0000 mg | ORAL_CAPSULE | Freq: Four times a day (QID) | ORAL | Status: DC | PRN
Start: 2021-01-31 — End: 2021-02-02

## 2021-01-31 MED ORDER — EPHEDRINE 5 MG/ML INJ: 10.0000 mg | INTRAVENOUS | Status: DC | PRN

## 2021-01-31 MED ORDER — OXYCODONE HCL 5 MG PO TABS: 10.0000 mg | ORAL_TABLET | ORAL | Status: DC | PRN

## 2021-01-31 MED ORDER — PRENATAL MULTIVITAMIN CH
1.0000 | ORAL_TABLET | Freq: Every day | ORAL | Status: DC
  Administered 2021-02-01: 1 via ORAL
  Filled 2021-01-31: qty 1

## 2021-01-31 MED ORDER — ZOLPIDEM TARTRATE 5 MG PO TABS: 5.0000 mg | ORAL_TABLET | Freq: Every evening | ORAL | Status: DC | PRN

## 2021-01-31 MED ORDER — OXYCODONE-ACETAMINOPHEN 5-325 MG PO TABS: 1.0000 | ORAL_TABLET | ORAL | Status: DC | PRN

## 2021-01-31 MED ORDER — PENICILLIN G POT IN DEXTROSE 60000 UNIT/ML IV SOLN
3.0000 10*6.[IU] | INTRAVENOUS | Status: DC
  Administered 2021-01-31 (×2): 3 10*6.[IU] via INTRAVENOUS
  Filled 2021-01-31 (×2): qty 50

## 2021-01-31 MED ORDER — LACTATED RINGERS IV SOLN: 500.0000 mL | INTRAVENOUS | Status: DC | PRN

## 2021-01-31 MED ORDER — SODIUM CHLORIDE 0.9 % IV SOLN
5.0000 10*6.[IU] | Freq: Once | INTRAVENOUS | Status: AC
  Administered 2021-01-31: 5 10*6.[IU] via INTRAVENOUS
  Filled 2021-01-31: qty 5

## 2021-01-31 MED ORDER — OXYTOCIN-SODIUM CHLORIDE 30-0.9 UT/500ML-% IV SOLN
2.5000 [IU]/h | INTRAVENOUS | Status: DC
  Administered 2021-01-31: 2.5 [IU]/h via INTRAVENOUS

## 2021-01-31 MED ORDER — FENTANYL-BUPIVACAINE-NACL 0.5-0.125-0.9 MG/250ML-% EP SOLN
EPIDURAL | Status: DC | PRN
  Administered 2021-01-31: 12 mL/h via EPIDURAL

## 2021-01-31 MED ORDER — IBUPROFEN 600 MG PO TABS
600.0000 mg | ORAL_TABLET | Freq: Four times a day (QID) | ORAL | Status: DC
  Administered 2021-02-01 (×3): 600 mg via ORAL
  Filled 2021-01-31 (×4): qty 1

## 2021-01-31 MED ORDER — LACTATED RINGERS IV SOLN: 500.0000 mL | Freq: Once | INTRAVENOUS | Status: DC

## 2021-01-31 MED ORDER — DIPHENHYDRAMINE HCL 50 MG/ML IJ SOLN: 12.5000 mg | INTRAMUSCULAR | Status: DC | PRN

## 2021-01-31 MED ORDER — TERBUTALINE SULFATE 1 MG/ML IJ SOLN: 0.2500 mg | Freq: Once | INTRAMUSCULAR | Status: DC | PRN

## 2021-01-31 MED ORDER — SOD CITRATE-CITRIC ACID 500-334 MG/5ML PO SOLN
30.0000 mL | ORAL | Status: DC | PRN
  Administered 2021-01-31: 30 mL via ORAL
  Filled 2021-01-31: qty 15

## 2021-01-31 MED ORDER — MISOPROSTOL 25 MCG QUARTER TABLET
25.0000 ug | ORAL_TABLET | ORAL | Status: DC | PRN
  Administered 2021-01-31 (×2): 25 ug via VAGINAL
  Filled 2021-01-31 (×2): qty 1

## 2021-01-31 MED ORDER — ACETAMINOPHEN 325 MG PO TABS: 650.0000 mg | ORAL_TABLET | ORAL | Status: DC | PRN

## 2021-01-31 MED ORDER — OXYTOCIN BOLUS FROM INFUSION
333.0000 mL | Freq: Once | INTRAVENOUS | Status: AC
  Administered 2021-01-31: 333 mL via INTRAVENOUS

## 2021-01-31 MED ORDER — LIDOCAINE HCL (PF) 1 % IJ SOLN
INTRAMUSCULAR | Status: DC | PRN
  Administered 2021-01-31 (×2): 4 mL via EPIDURAL

## 2021-01-31 MED ORDER — WITCH HAZEL-GLYCERIN EX PADS: 1.0000 "application " | MEDICATED_PAD | CUTANEOUS | Status: DC | PRN

## 2021-01-31 MED ORDER — OXYTOCIN-SODIUM CHLORIDE 30-0.9 UT/500ML-% IV SOLN
1.0000 m[IU]/min | INTRAVENOUS | Status: DC
  Filled 2021-01-31: qty 500

## 2021-01-31 MED ORDER — TETANUS-DIPHTH-ACELL PERTUSSIS 5-2.5-18.5 LF-MCG/0.5 IM SUSY: 0.5000 mL | PREFILLED_SYRINGE | Freq: Once | INTRAMUSCULAR | Status: DC

## 2021-01-31 MED ORDER — PHENYLEPHRINE 40 MCG/ML (10ML) SYRINGE FOR IV PUSH (FOR BLOOD PRESSURE SUPPORT): 80.0000 ug | PREFILLED_SYRINGE | INTRAVENOUS | Status: DC | PRN

## 2021-01-31 MED ORDER — OXYCODONE-ACETAMINOPHEN 5-325 MG PO TABS: 2.0000 | ORAL_TABLET | ORAL | Status: DC | PRN

## 2021-01-31 MED ORDER — LIDOCAINE HCL (PF) 1 % IJ SOLN: 30.0000 mL | INTRAMUSCULAR | Status: DC | PRN

## 2021-01-31 MED ORDER — ONDANSETRON HCL 4 MG/2ML IJ SOLN: 4.0000 mg | INTRAMUSCULAR | Status: DC | PRN

## 2021-01-31 MED ORDER — SENNOSIDES-DOCUSATE SODIUM 8.6-50 MG PO TABS
2.0000 | ORAL_TABLET | Freq: Every day | ORAL | Status: DC
  Administered 2021-02-01: 2 via ORAL
  Filled 2021-01-31: qty 2

## 2021-01-31 MED ORDER — FENTANYL-BUPIVACAINE-NACL 0.5-0.125-0.9 MG/250ML-% EP SOLN
12.0000 mL/h | EPIDURAL | Status: DC | PRN
  Filled 2021-01-31: qty 250

## 2021-01-31 MED ORDER — BENZOCAINE-MENTHOL 20-0.5 % EX AERO: 1.0000 "application " | INHALATION_SPRAY | CUTANEOUS | Status: DC | PRN

## 2021-01-31 MED ORDER — SIMETHICONE 80 MG PO CHEW: 80.0000 mg | CHEWABLE_TABLET | ORAL | Status: DC | PRN

## 2021-01-31 MED ORDER — ONDANSETRON HCL 4 MG/2ML IJ SOLN
4.0000 mg | Freq: Four times a day (QID) | INTRAMUSCULAR | Status: DC | PRN
  Administered 2021-01-31: 4 mg via INTRAVENOUS
  Filled 2021-01-31: qty 2

## 2021-01-31 MED ORDER — OXYCODONE HCL 5 MG PO TABS: 5.0000 mg | ORAL_TABLET | ORAL | Status: DC | PRN

## 2021-01-31 MED ORDER — LACTATED RINGERS IV SOLN: INTRAVENOUS | Status: DC

## 2021-01-31 MED ORDER — ONDANSETRON HCL 4 MG PO TABS: 4.0000 mg | ORAL_TABLET | ORAL | Status: DC | PRN

## 2021-01-31 MED ORDER — ACETAMINOPHEN 325 MG PO TABS
650.0000 mg | ORAL_TABLET | ORAL | Status: DC | PRN
  Administered 2021-02-01: 650 mg via ORAL
  Filled 2021-01-31: qty 2

## 2021-01-31 MED ORDER — COCONUT OIL OIL
1.0000 "application " | TOPICAL_OIL | Status: DC | PRN
  Administered 2021-02-01: 1 via TOPICAL

## 2021-01-31 MED ORDER — DIBUCAINE (PERIANAL) 1 % EX OINT: 1.0000 "application " | TOPICAL_OINTMENT | CUTANEOUS | Status: DC | PRN

## 2021-01-31 MED ORDER — FENTANYL CITRATE (PF) 100 MCG/2ML IJ SOLN
50.0000 ug | INTRAMUSCULAR | Status: DC | PRN
  Administered 2021-01-31: 100 ug via INTRAVENOUS
  Filled 2021-01-31: qty 2

## 2021-01-31 NOTE — H&P (Signed)
Stephanie Wilkinson is a 30 y.o. female presenting for IOL for chronic hypertension  30 yo G3P1011 @ 39+1 presents for IOL for chronic hypertension controlled without medication. Her blood pressures have remained in the mild range during the pregnancy.   Last [place] @34  weeks 3122gm (86%) 6#14 OB History    Gravida  3   Para  1   Term  1   Preterm  0   AB  1   Living  1     SAB  0   IAB  0   Ectopic  1   Multiple      Live Births  1          Past Medical History:  Diagnosis Date  . Medical history non-contributory    Past Surgical History:  Procedure Laterality Date  . CHOLECYSTECTOMY    . NO PAST SURGERIES     Family History: family history is not on file. Social History:  reports that she quit smoking about 8 years ago. She has never used smokeless tobacco. She reports current alcohol use of about 1.0 standard drink of alcohol per week. She reports that she does not use drugs.     Maternal Diabetes: No Genetic Screening: Declined Maternal Ultrasounds/Referrals: Normal Fetal Ultrasounds or other Referrals:  None Maternal Substance Abuse:  No Significant Maternal Medications:  None Significant Maternal Lab Results:  Group B Strep positive Other Comments:  None  Review of Systems History Dilation: 3 Effacement (%): 80 Station: Ballotable Exam by:: Dr. [email or web address] Blood pressure (!) 141/79, pulse 91, temperature 98.3 F (36.8 C), temperature source Oral, resp. rate 18, height 5' 10.5" (1.791 m), weight (!) 138.6 kg, last menstrual period 02/23/2020, unknown if currently breastfeeding. Exam Physical Exam  Prenatal labs: ABO, Rh: --/--/O POS (02/08 0226) Antibody: NEG (02/08 0226) Rubella: Immune (07/12 0000) RPR:   NR HBsAg: Negative (07/12 0000)  HIV: Non-reactive (07/12 0000)  GBS: Positive/-- (11/09 0000)   Assessment/Plan: 1) Admit 2) misoprostal followed by AROM/pit 3) Epidural on request  4) PCN for GBS  04-26-1988 01/31/2021, 10:06  AM

## 2021-01-31 NOTE — Anesthesia Preprocedure Evaluation (Signed)
Anesthesia Evaluation  Patient identified by MRN, date of birth, ID band Patient awake    Reviewed: Allergy & Precautions, Patient's Chart, lab work & pertinent test results  History of Anesthesia Complications Negative for: history of anesthetic complications  Airway Mallampati: II  TM Distance: >3 FB Neck ROM: Full    Dental no notable dental hx.    Pulmonary former smoker,    Pulmonary exam normal        Cardiovascular hypertension, Normal cardiovascular exam     Neuro/Psych negative neurological ROS  negative psych ROS   GI/Hepatic negative GI ROS, Neg liver ROS,   Endo/Other  negative endocrine ROS  Renal/GU negative Renal ROS  negative genitourinary   Musculoskeletal negative musculoskeletal ROS (+)   Abdominal   Peds  Hematology negative hematology ROS (+)   Anesthesia Other Findings Day of surgery medications reviewed with patient.  Reproductive/Obstetrics (+) Pregnancy                             Anesthesia Physical Anesthesia Plan  ASA: III  Anesthesia Plan: Epidural   Post-op Pain Management:    Induction:   PONV Risk Score and Plan: Treatment may vary due to age or medical condition  Airway Management Planned: Natural Airway  Additional Equipment:   Intra-op Plan:   Post-operative Plan:   Informed Consent: I have reviewed the patients History and Physical, chart, labs and discussed the procedure including the risks, benefits and alternatives for the proposed anesthesia with the patient or authorized representative who has indicated his/her understanding and acceptance.       Plan Discussed with:   Anesthesia Plan Comments:         Anesthesia Quick Evaluation

## 2021-01-31 NOTE — Anesthesia Procedure Notes (Signed)
Epidural Patient location during procedure: OB Start time: 01/31/2021 11:57 AM End time: 01/31/2021 12:00 PM  Staffing Anesthesiologist: Kaylyn Layer, MD Performed: anesthesiologist   Preanesthetic Checklist Completed: patient identified, IV checked, risks and benefits discussed, monitors and equipment checked, pre-op evaluation and timeout performed  Epidural Patient position: sitting Prep: DuraPrep and site prepped and draped Patient monitoring: continuous pulse ox, blood pressure and heart rate Approach: midline Location: L3-L4 Injection technique: LOR air  Needle:  Needle type: Tuohy  Needle gauge: 17 G Needle length: 9 cm Needle insertion depth: 7 cm Catheter type: closed end flexible Catheter size: 19 Gauge Catheter at skin depth: 12 cm Test dose: negative and Other (1% lidocaine)  Assessment Events: blood not aspirated, injection not painful, no injection resistance, no paresthesia and negative IV test  Additional Notes Patient identified. Risks, benefits, and alternatives discussed with patient including but not limited to bleeding, infection, nerve damage, paralysis, failed block, incomplete pain control, headache, blood pressure changes, nausea, vomiting, reactions to medication, itching, and postpartum back pain. Confirmed with bedside nurse the patient's most recent platelet count. Confirmed with patient that they are not currently taking any anticoagulation, have any bleeding history, or any family history of bleeding disorders. Patient expressed understanding and wished to proceed. All questions were answered. Sterile technique was used throughout the entire procedure. Please see nursing notes for vital signs.   Crisp LOR on first pass. Test dose was given through epidural catheter and negative prior to continuing to dose epidural or start infusion. Warning signs of high block given to the patient including shortness of breath, tingling/numbness in hands, complete  motor block, or any concerning symptoms with instructions to call for help. Patient was given instructions on fall risk and not to get out of bed. All questions and concerns addressed with instructions to call with any issues or inadequate analgesia.  Reason for block:procedure for pain

## 2021-01-31 NOTE — Lactation Note (Signed)
This note was copied from a baby's chart. Lactation Consultation Note  Patient Name: Stephanie Wilkinson Date: 01/31/2021 Reason for consult: L&D Initial assessment;Term Age:33 hours  Visited with mom of 1 hour old FT female, she's a P2 and had some experience BF. Mom and baby doing STS when Stephanie Wilkinson and RN Crystal entered the room. She reported baby has already latched on, but that she would like to try again.   Assisted with latch, baby would slip on and off the breast and would cry at times. Noticed that she has large breasts with everted nipples but they're short shafted. RN Crystal assisted mom to relatch every time baby was cueing. Baby still feeding when exiting the room at the 8 minutes mark. Reviewed normal newborn behavior, feeding cues, cluster feeding and size of baby's stomach.   Feeding plan:  1. Encouraged mom to feed baby STS 8-12 times/24 hours or sooner if feeding cues are present 2. Hand expression and breast massage were also encouraged prior feedings  No literature provided due to the nature of this L&D consultation, mom will need a BF brochure and resources once transferred to the [facility]. FOB and GOB (maternal) present at the time of [facility] consultation. Family reported all questions and concerns were answered, they're both aware of LC OP services and will call PRN.   Maternal Data Has patient been taught Hand Expression?: Yes Does the patient have breastfeeding experience prior to this delivery?: Yes How long did the patient breastfeed?: 3 months  Feeding Mother's Current Feeding Choice: Breast Milk  LATCH Score Latch: Repeated attempts needed to sustain latch, nipple held in mouth throughout feeding, stimulation needed to elicit sucking reflex.  Audible Swallowing: A few with stimulation  Type of Nipple: Everted at rest and after stimulation  Comfort (Breast/Nipple): Soft / non-tender  Hold (Positioning): Assistance needed to correctly position infant at  breast and maintain latch.  LATCH Score: 7   Lactation Tools Discussed/Used    Interventions Interventions: Breast feeding basics reviewed  Discharge Pump: Personal (single electric breast pump at home) [facility] Program: No  Consult Status Consult Status: Follow-up Date: 01/31/21 Follow-up type: In-patient    Jontue Crumpacker Venetia Constable 01/31/2021, 5:25 PM

## 2021-02-01 ENCOUNTER — Encounter (HOSPITAL_COMMUNITY): Payer: Self-pay | Admitting: Obstetrics and Gynecology

## 2021-02-01 LAB — CBC
HCT: 32.8 % — ABNORMAL LOW (ref 36.0–46.0)
Hemoglobin: 10.6 g/dL — ABNORMAL LOW (ref 12.0–15.0)
MCH: 28.3 pg (ref 26.0–34.0)
MCHC: 32.3 g/dL (ref 30.0–36.0)
MCV: 87.7 fL (ref 80.0–100.0)
Platelets: 204 10*3/uL (ref 150–400)
RBC: 3.74 MIL/uL — ABNORMAL LOW (ref 3.87–5.11)
RDW: 14.6 % (ref 11.5–15.5)
WBC: 14.4 10*3/uL — ABNORMAL HIGH (ref 4.0–10.5)
nRBC: 0 % (ref 0.0–0.2)

## 2021-02-01 MED ORDER — SENNOSIDES-DOCUSATE SODIUM 8.6-50 MG PO TABS: 2.0000 | ORAL_TABLET | Freq: Every day | ORAL | 2 refills | Status: AC

## 2021-02-01 MED ORDER — IBUPROFEN 600 MG PO TABS: 600.0000 mg | ORAL_TABLET | Freq: Four times a day (QID) | ORAL | 0 refills | Status: AC | PRN

## 2021-02-01 MED ORDER — ACETAMINOPHEN 325 MG PO TABS: 650.0000 mg | ORAL_TABLET | Freq: Four times a day (QID) | ORAL | 0 refills | Status: AC | PRN

## 2021-02-01 NOTE — Lactation Note (Signed)
This note was copied from a baby's chart. Lactation Consultation Note  Patient Name: Stephanie Wilkinson [id number] Date: 02/01/2021 Reason for consult: Follow-up assessment;Mother's request;Term;Hyperbilirubinemia;Infant weight loss (Infant with -4% weight loss.) Age:32 hours P2, term female infant with weight loss and LGA, with evaluated TcB will be re-assessed tonight prior to discharge. Per mom, most feedings are 20 minutes in length, mom having some discomfort with latches and few mild abrasions, mom given coconut oil and comfort gels. Mom understands not to use comfort gels with coconut oil, wear comfort gels in bra. Mom is supplementing infant with formula, LC assisted mom with latching infant at breast using the football hold position. Mom hasn't tired this position with infant, Mom latched infant on her left breast using the football hold, infant latched with depth, swallows heard, infant was supplemented at the breast with curve tip syringe and took (11 mls) infant breast feed for 15 minutes. Dad assisted mom with supplementing infant at the breast using the curve tip syringe and  felt comfortable helping mom. Per mom, she is not feeling pain with this latch only a tug. Mom knows to break latch and re-latch infant if she feels pain. Mom knows to Breastfeed infant according to cues, 8 to 12+ times or as much as infant wants. Mom plans to latch infant with each feeding and supplement infant at breast with curve tip syringe or use bottle. Mom has breastfeeding supplemental sheet based on infant's age/ hours of life. Per mom, it takes awhile for her milk to come in she BF her son for few months, she decided to BF and supplement infant due her son having high weight loss that was greater than 1 lb. LC discussed engorgement treatment and prevention. LC discussed LC outpatient clinic, LC hotline and LC online Breastfeeding Support Group within the local community (free) after hospital  discharge.   LC gave parents Breastfeeding Consult services brochure and Online resources "[employer]".  Maternal Data    Feeding Mother's Current Feeding Choice: Breast Milk and Formula  LATCH Score Latch: Grasps breast easily, tongue down, lips flanged, rhythmical sucking.  Audible Swallowing: Spontaneous and intermittent  Type of Nipple: Everted at rest and after stimulation  Comfort (Breast/Nipple): Filling, red/small blisters or bruises, mild/mod discomfort  Hold (Positioning): Assistance needed to correctly position infant at breast and maintain latch.  LATCH Score: 8   Lactation Tools Discussed/Used Tools: Comfort gels;Coconut oil  Interventions Interventions: Assisted with latch;Skin to skin;Breast massage;Hand express;Breast compression;Adjust position;Support pillows;Position options;Expressed milk;Coconut oil;Comfort gels;Education  Discharge Discharge Education: Engorgement and breast care;Warning signs for feeding baby;Outpatient recommendation Pump: Personal WIC Program: No  Consult Status Consult Status: Complete Date: 02/02/21 Follow-up type: Physician    Danelle Earthly 02/01/2021, 5:48 PM

## 2021-02-01 NOTE — Anesthesia Postprocedure Evaluation (Signed)
Anesthesia Post Note  Patient: Stephanie Wilkinson  Procedure(s) Performed: AN AD HOC LABOR EPIDURAL     Patient location during evaluation: Mother Baby Anesthesia Type: Epidural Level of consciousness: awake and alert and oriented Pain management: satisfactory to patient Vital Signs Assessment: post-procedure vital signs reviewed and stable Respiratory status: respiratory function stable Cardiovascular status: stable Postop Assessment: no headache, no backache, epidural receding, patient able to bend at knees, no signs of nausea or vomiting, adequate PO intake and able to ambulate Anesthetic complications: no   No complications documented.  Last Vitals:  Vitals:   02/01/21 0318 02/01/21 0653  BP: (!) 143/87 133/82  Pulse: (!) 110 90  Resp: 15 15  Temp: 36.7 C 36.6 C  SpO2: 99%     Last Pain:  Vitals:   02/01/21 0725  TempSrc:   PainSc: 0-No pain   Pain Goal:                   Ernest Orr

## 2021-02-01 NOTE — Progress Notes (Addendum)
Post Partum Day 1 Subjective: Pt feeling tired this morning but with no additional complaints. Ambulating, voiding, tolerating PO. No chest pain, shortness of breath, calf pain, headache, vision changes, RUQ pain. Desires discharge home.   Objective: Patient Vitals for the past 24 hrs:  BP Temp Temp src Pulse Resp SpO2  02/01/21 0653 133/82 97.9 F (36.6 C) Oral 90 15 -  02/01/21 0318 (!) 143/87 98.1 F (36.7 C) Oral (!) 110 15 99 %  01/31/21 2320 (!) 142/72 98.3 F (36.8 C) Oral 90 17 100 %  01/31/21 1926 132/76 98.8 F (37.1 C) Oral 82 16 100 %  01/31/21 1800 (!) 149/63 98.7 F (37.1 C) Oral 78 18 99 %  01/31/21 1715 134/74 - - 99 - -  01/31/21 1700 132/79 - - 93 - -  01/31/21 1645 (!) 132/105 - - (!) 115 - -  01/31/21 1630 (!) 143/56 - - 86 - -  01/31/21 1617 140/63 - - 94 - -  01/31/21 1530 138/79 - - 89 - -  01/31/21 1501 136/80 - - 91 - -  01/31/21 1431 124/63 - - 85 - -  01/31/21 1403 126/60 - - 91 - -  01/31/21 1331 138/84 - - 79 - -  01/31/21 1301 130/78 - - 87 - -  01/31/21 1226 119/65 - - 82 - -  01/31/21 1220 (!) 115/58 98.1 F (36.7 C) Oral 82 - -  01/31/21 1216 129/73 - - 81 - -  01/31/21 1211 132/65 - - 82 - -  01/31/21 1208 (!) 141/68 - - 78 - -  01/31/21 1200 139/82 - - 88 - 100 %  01/31/21 0910 (!) 146/73 - - 93 - -    Physical Exam:  General: alert, cooperative and no distress Lochia: appropriate Uterine Fundus: firm DVT Evaluation: No evidence of DVT seen on physical exam.  Recent Labs    01/31/21 0226 01/31/21 1056 01/31/21 1720 02/01/21 0501  WBC 11.0* 10.3 17.5* 14.4*  HGB 12.1 11.8* 11.3* 10.6*  HCT 35.1* 36.4 34.7* 32.8*  PLT 195 182 203 204    Recent Labs    01/31/21 0226  NA 136  K 3.9  CL 106  BUN 8  CREATININE 0.54  GLUCOSE 94  BILITOT 0.4  ALT 21  AST 21  ALKPHOS 106  PROT 6.2*  ALBUMIN 2.6*    Recent Labs    01/31/21 0226  CALCIUM 9.3    No results for input(s): PROTIME, APTT, INR in the last 72 hours.  No  results for input(s): PROTIME, APTT, INR, FIBRINOGEN in the last 72 hours. Assessment/Plan:  Stephanie Wilkinson 29 y.o. [id number] PPD#1 sp SVD 1. PPC: continue routine postpartum care 2. 3rd degree lac: continue stool softener 3. Chronic hypertension: not on meds. Since delivery, ~50% of Bps have been mildly elevated. Most recent BP 133/82. Will continue to monitor blood pressures this morning and if remains normotensive, will plan to d/c home this afternoon with office BP check in 2-3 days. Patient has been reliably checking blood pressures at home throughout pregnancy.  4. Rh pos, rubella immune. S/p tdap. Declined COVID vaccines. 5. Dispo: likely d/c home this afternoon pending BP control.    LOS: 1 day   Charlett Nose 02/01/2021, 8:20 AM

## 2021-02-01 NOTE — Discharge Summary (Signed)
Postpartum Discharge Summary    Patient Name: Stephanie Wilkinson DOB: [birth year] MRN: [id number]  Date of admission: 01/31/2021 Delivery date:01/31/2021  Delivering provider: Vanessa Kick  Date of discharge: 02/01/2021  Admitting diagnosis: Benign essential hypertension antepartum in third trimester [O10.013] Spontaneous vaginal delivery [O80] Intrauterine pregnancy: [redacted]w[redacted]d     Secondary diagnosis:  Active Problems:   Benign essential hypertension antepartum in third trimester   Spontaneous vaginal delivery   Additional problems: none    Discharge diagnosis: Term Pregnancy Delivered and CHTN                                              Post partum procedures:none Augmentation: Pitocin and Cytotec Complications: None  Hospital course: Induction of Labor With Vaginal Delivery   30 y.o. yo [id number] at [redacted]w[redacted]d was admitted to the hospital 01/31/2021 for induction of labor.  Indication for induction: chronic hypertension.  Patient had an uncomplicated labor course as follows: Membrane Rupture Time/Date: 10:00 AM ,01/31/2021   Delivery Method:Vaginal, Spontaneous  Episiotomy: None  Lacerations:  3rd degree  Details of delivery can be found in separate delivery note.  Patient had a routine postpartum course. Patient is discharged home 02/01/21.  Newborn Data: Birth date:01/31/2021  Birth time:4:00 PM  Gender:Female  Living status:Living  Apgars:8 ,9  Weight:4082 g   Magnesium Sulfate received: No BMZ received: No Rhophylac:N/A MMR:N/A T-DaP:Given prenatally Flu: No Transfusion:No  Physical exam  Vitals:   01/31/21 2320 02/01/21 0318 02/01/21 0653 02/01/21 1357  BP: (!) 142/72 (!) 143/87 133/82 137/80  Pulse: 90 (!) 110 90 85  Resp: $Remo'17 15 15 18  'cXXdv$ Temp: 98.3 F (36.8 C) 98.1 F (36.7 C) 97.9 F (36.6 C) 98.7 F (37.1 C)  TempSrc: Oral Oral Oral Oral  SpO2: 100% 99%    Weight:      Height:       General: alert, cooperative and no distress Lochia: appropriate Uterine  Fundus: firm Incision: N/A DVT Evaluation: No evidence of DVT seen on physical exam. Labs: Lab Results  Component Value Date   WBC 14.4 (H) 02/01/2021   HGB 10.6 (L) 02/01/2021   HCT 32.8 (L) 02/01/2021   MCV 87.7 02/01/2021   PLT 204 02/01/2021   CMP Latest Ref Rng & Units 01/31/2021  Glucose 70 - 99 mg/dL 94  BUN 6 - 20 mg/dL 8  Creatinine 0.44 - 1.00 mg/dL 0.54  Sodium 135 - 145 mmol/L 136  Potassium 3.5 - 5.1 mmol/L 3.9  Chloride 98 - 111 mmol/L 106  CO2 22 - 32 mmol/L 18(L)  Calcium 8.9 - 10.3 mg/dL 9.3  Total Protein 6.5 - 8.1 g/dL 6.2(L)  Total Bilirubin 0.3 - 1.2 mg/dL 0.4  Alkaline Phos 38 - 126 U/L 106  AST 15 - 41 U/L 21  ALT 0 - 44 U/L 21   Edinburgh Score: No flowsheet data found.    After visit meds:  Allergies as of 02/01/2021   No Known Allergies     Medication List    STOP taking these medications   aspirin EC 81 MG tablet   meclizine 25 MG tablet Commonly known as: ANTIVERT     TAKE these medications   acetaminophen 325 MG tablet Commonly known as: Tylenol Take 2 tablets (650 mg total) by mouth every 6 (six) hours as needed (for pain scale < 4).   famotidine  20 MG tablet Commonly known as: PEPCID Take 1 tablet (20 mg total) by mouth 2 (two) times daily.   Fish Oil 1200 MG Caps Take 1,200 mg by mouth daily.   ibuprofen 600 MG tablet Commonly known as: ADVIL Take 1 tablet (600 mg total) by mouth every 6 (six) hours as needed.   prenatal multivitamin Tabs tablet Take 1 tablet by mouth daily at 12 noon.   senna-docusate 8.6-50 MG tablet Commonly known as: Senokot-S Take 2 tablets by mouth daily. Start taking on: February 02, 2021   vitamin B-12 1000 MCG tablet Commonly known as: CYANOCOBALAMIN Take 1,000 mcg by mouth daily.        Discharge home in stable condition Infant Feeding: Breast Infant Disposition:home with mother Discharge instruction: per After Visit Summary and Postpartum booklet. Activity: Advance as tolerated.  Pelvic rest for 6 weeks.  Diet: routine diet Anticipated Birth Control: Unsure Postpartum Appointment:4 weeks Additional Postpartum F/U: BP check 2-3 days Future Appointments:No future appointments. Follow up Visit:  Follow-up Information    Rowland Lathe, MD. Schedule an appointment as soon as possible for a visit on 02/06/2021.   Specialty: Obstetrics and Gynecology Contact information: [address] [place] [address] [phone]        Vanessa Kick, MD Follow up in 4 week(s).   Specialty: Obstetrics and Gynecology Contact information: [address] [address] [phone]                   02/01/2021 Rowland Lathe, MD

## 2021-02-01 NOTE — Discharge Instructions (Signed)
Postpartum Hypertension Postpartum hypertension is high blood pressure that is higher than normal after childbirth. It usually starts within 1 to 2 days after delivery, but it can happen at any time for up to 6 weeks after delivery. For some women, medical treatment is required to prevent serious complications, such as seizures or stroke. What are the causes? The cause of this condition is not well understood. In some cases, the cause may not be known. Certain conditions may increase your risk. These include:  Hypertension that existed before pregnancy (chronic hypertension).  Hypertension that comes as a result of pregnancy (gestational hypertension).  Hypertensive disorders during pregnancy or seizures in women who have high blood pressure during pregnancy. These conditions are called preeclampsia and eclampsia.  A condition in which the liver, platelets, and red blood cells are damaged during pregnancy (HELLP syndrome).  Obesity.  Diabetes. What are the signs or symptoms? As with all types of hypertension, postpartum hypertension may not have any symptoms. Depending on how high your blood pressure is, you may experience:  Headaches. These may be mild, moderate, or severe. They may also be steady, constant, or sudden in onset (thunderclap headache).  Vision changes, such as blurry vision, flashing lights, or seeing spots.  Nausea and vomiting.  Pain in the upper right side of your abdomen.  Shortness of breath.  Difficulty breathing while lying down.  A decrease in the amount of urine that you pass. How is this diagnosed? This condition may be diagnosed based on the results of a physical exam, blood pressure measurements, and blood and urine tests. You may also have other tests, such as a CT scan or an MRI, to check for other problems of postpartum hypertension. How is this treated? If blood pressure is high enough to require treatment, your options may include:  Medicines to  reduce blood pressure (antihypertensives). Tell your health care provider if you are breastfeeding or if you plan to breastfeed. There are many antihypertensive medicines that are safe to take while breastfeeding.  Treating medical conditions that are causing hypertension.  Treating the complications of hypertension, such as seizures, stroke, or kidney problems. Your health care provider will also continue to monitor your blood pressure closely until it is within a safe range for you. Follow these instructions at home: Learn your goal blood pressure Two numbers make up your blood pressure. The first number is called systolic pressure. The second is called diastolic pressure. An example of a blood pressure reading is "120 over 80" (or 120/80). For most people, goal blood pressure is:  First number: below 140.  Second number: below 90. Your blood pressure is above normal even if only the top or bottom number is above normal. Know what to do before you take your blood pressure 30 minutes before you check your blood pressure:  Do not drink caffeine.  Do not drink alcohol.  Avoid food and drink.  Do not smoke.  Do not exercise. 5 minutes before you check your blood pressure:  Use the bathroom and urinate so that you have an empty bladder.  Sit quietly in a dining room chair. Do not sit in a soft couch or an armchair. Do not talk. Know how to take your blood pressure To check your blood pressure, follow the instructions in the manual that came with your blood pressure monitor. If you have a digital blood pressure monitor, the instructions may be as follows: 1. Sit up straight. 2. Place your feet on the floor. Do   not cross your ankles or legs. 3. Rest your left arm at the level of your heart. You may rest it on a table, desk, or chair. 4. Pull up your shirt sleeve. 5. Wrap the blood pressure cuff around the upper part of your left arm. The cuff should be 1 inch (2.5 cm) above your  elbow. It is best to wrap the cuff around bare skin. 6. Fit the cuff snugly around your arm. You should be able to place only one finger between the cuff and your arm. 7. Put the cord inside the groove of your elbow. 8. Press the power button. 9. Sit quietly while the cuff fills with air and loses air. 10. Write down the numbers on the screen. These are your blood pressure readings. 11. Wait 1-2 minutes and then repeat steps 1-10.   Record your blood pressure readings Follow your health care provider's instructions on how to record your blood pressure readings. If you were asked to use this form, follow these instructions:  Get one reading in the morning (a.m.) before you take any medicines.  Get one reading in the evening (p.m.) before supper.  Take at least 2 readings with each blood pressure check. This makes sure the results are correct. Wait 1-2 minutes between measurements.  Write down the results in the spaces on this form. Date: _______________________  a.m. _____________________(1st reading) _____________________(2nd reading)  p.m. _____________________(1st reading) _____________________(2nd reading) Date: _______________________  a.m. _____________________(1st reading) _____________________(2nd reading)  p.m. _____________________(1st reading) _____________________(2nd reading) Date: _______________________  a.m. _____________________(1st reading) _____________________(2nd reading)  p.m. _____________________(1st reading) _____________________(2nd reading) Date: _______________________  a.m. _____________________(1st reading) _____________________(2nd reading)  p.m. _____________________(1st reading) _____________________(2nd reading) Date: _______________________  a.m. _____________________(1st reading) _____________________(2nd reading)  p.m. _____________________(1st reading) _____________________(2nd reading) General instructions  Take over-the-counter and  prescription medicines only as told by your health care provider.  Do not use any products that contain nicotine or tobacco. These products include cigarettes, chewing tobacco, and vaping devices, such as e-cigarettes. If you need help quitting, ask your health care provider.  Check your blood pressure as often as recommended by your health care provider.  Return to your normal activities as told by your health care provider. Ask your health care provider what activities are safe for you.  Keep all follow-up visits. This is important. Contact a health care provider if:  You have new symptoms, such as: ? A headache that does not get better. ? Dizziness. ? Visual changes. ? Nausea and vomiting. Get help right away if:  You develop difficulty breathing.  You have chest pain.  You faint.  You have any symptoms of a stroke. "BE FAST" is an easy way to remember the main warning signs of a stroke: ? B - Balance. Signs are dizziness, sudden trouble walking, or loss of balance. ? E - Eyes. Signs are trouble seeing or a sudden change in vision. ? F - Face. Signs are sudden weakness or numbness of the face, or the face or eyelid drooping on one side. ? A - Arms. Signs are weakness or numbness in an arm. This happens suddenly and usually on one side of the body. ? S - Speech. Signs are sudden trouble speaking, slurred speech, or trouble understanding what people say. ? T - Time. Time to call emergency services. Write down what time symptoms started.  You have other signs of a stroke, such as: ? A sudden, severe headache with no known cause. ? Nausea or vomiting. ? Seizure. These symptoms   may represent a serious problem that is an emergency. Do not wait to see if the symptoms will go away. Get medical help right away. Call your local emergency services (911 in the [place] Do not drive yourself to the hospital. Summary  Postpartum hypertension is high blood pressure that remains higher than  normal after childbirth.  For some women, medical treatment is required to prevent serious complications, such as seizures or stroke.  Follow your health care provider's instructions on how to record your blood pressure readings.  Keep all follow-up visits. This is important. This information is not intended to replace advice given to you by your health care provider. Make sure you discuss any questions you have with your health care provider. Document Revised: 09/06/2020 Document Reviewed: 09/06/2020 Elsevier Patient Education  2021 [employer] Postpartum Care After Vaginal Delivery The following information offers guidance about how to care for yourself from the time you deliver your baby to 6-12 weeks after delivery (postpartum period). If you have problems or questions, contact your health care provider for more specific instructions. Follow these instructions at home: Vaginal bleeding  It is normal to have vaginal bleeding (lochia) after delivery. Wear a sanitary pad for bleeding and discharge. ? During the first week after delivery, the amount and appearance of lochia is often similar to a menstrual period. ? Over the next few weeks, it will gradually decrease to a dry, yellow-brown discharge. ? For most women, lochia stops completely by 4-6 weeks after delivery, but can vary.  Change your sanitary pads frequently. Watch for any changes in your flow, such as: ? A sudden increase in volume. ? A change in color. ? Large blood clots.  If you pass a blood clot from your vagina, save it and call your health care provider. Do not flush blood clots down the toilet before talking with your health care provider.  Do not use tampons or douches until your health care provider approves.  If you are not breastfeeding, your period should return 6-8 weeks after delivery. If you are feeding your baby breast milk only, your period may not return until you stop breastfeeding. Perineal  care  Keep the area between the vagina and the anus (perineum) clean and dry. Use medicated pads and pain-relieving sprays and creams as directed.  If you had a surgical cut in the perineum (episiotomy) or a tear, check the area for signs of infection until you are healed. Check for: ? More redness, swelling, or pain. ? Fluid or blood coming from the cut or tear. ? Warmth. ? Pus or a bad smell.  You may be given a squirt bottle to use instead of wiping to clean the perineum area after you use the bathroom. Pat the area gently to dry it.  To relieve pain caused by an episiotomy, a tear, or swollen veins in the anus (hemorrhoids), take a warm sitz bath 2-3 times a day. In a sitz bath, the warm water should only come up to your hips and cover your buttocks.   Breast care  In the first few days after delivery, your breasts may feel heavy, full, and uncomfortable (breast engorgement). Milk may also leak from your breasts. Ask your health care provider about ways to help relieve the discomfort.  If you are breastfeeding: ? Wear a bra that supports your breasts and fits well. Use breast pads to absorb milk that leaks. ? Keep your nipples clean and dry. Apply creams and ointments as told. ? You  may have uterine contractions every time you breastfeed for up to several weeks after delivery. This helps your uterus return to its normal size. ? If you have any problems with breastfeeding, notify your health care provider or lactation consultant.  If you are not breastfeeding: ? Avoid touching your breasts. Do not squeeze out (express) milk. Doing this can make your breasts produce more milk. ? Wear a good-fitting bra and use cold packs to help with swelling. Intimacy and sexuality  Ask your health care provider when you can engage in sexual activity. This may depend upon: ? Your risk of infection. ? How fast you are healing. ? Your comfort and desire to engage in sexual activity.  You are able to  get pregnant after delivery, even if you have not had your period. Talk with your health care provider about methods of birth control (contraception) or family planning if you desire future pregnancies. Medicines  Take over-the-counter and prescription medicines only as told by your health care provider.  Take an over-the-counter stool softener to help ease bowel movements as told by your health care provider.  If you were prescribed an antibiotic medicine, take it as told by your health care provider. Do not stop taking the antibiotic even if you start to feel better.  Review all previous and current prescriptions to check for possible transfer into breast milk. Activity  Gradually return to your normal activities as told by your health care provider.  Rest as much as possible. Nap while your baby is sleeping. Eating and drinking  Drink enough fluid to keep your urine pale yellow.  To help prevent or relieve constipation, eat high-fiber foods every day.  Choose healthy eating to support breastfeeding or weight loss goals.  Take your prenatal vitamins until your health care provider tells you to stop.   General tips/recommendations  Do not use any products that contain nicotine or tobacco. These products include cigarettes, chewing tobacco, and vaping devices, such as e-cigarettes. If you need help quitting, ask your health care provider.  Do not drink alcohol, especially if you are breastfeeding.  Do not take medications or drugs that are not prescribed to you, especially if you are breastfeeding.  Visit your health care provider for a postpartum checkup within the first 3-6 weeks after delivery.  Complete a comprehensive postpartum visit no later than 12 weeks after delivery.  Keep all follow-up visits for you and your baby. Contact a health care provider if:  You feel unusually sad or worried.  Your breasts become red, painful, or hard.  You have a fever or other signs  of an infection.  You have bleeding that is soaking through one pad an hour or you have blood clots.  You have a severe headache that doesn't go away or you have vision changes.  You have nausea and vomiting and are unable to eat or drink anything for 24 hours. Get help right away if:  You have chest pain or difficulty breathing.  You have sudden, severe leg pain.  You faint or have a seizure.  You have thoughts about hurting yourself or your baby. If you ever feel like you may hurt yourself or others, or have thoughts about taking your own life, get help right away. Go to your nearest emergency department or:  Call your local emergency services (911 in the [place]  The National Suicide Prevention Lifeline at 1-800-273-8255. This suicide crisis helpline is open 24 hours a day.  Text the Crisis Text   Line at 741741 (in the [place] Summary  The period of time after you deliver your newborn up to 6-12 weeks after delivery is called the postpartum period.  Keep all follow-up visits for you and your baby.  Review all previous and current prescriptions to check for possible transfer into breast milk.  Contact a health care provider if you feel unusually sad or worried during the postpartum period. This information is not intended to replace advice given to you by your health care provider. Make sure you discuss any questions you have with your health care provider. Document Revised: 08/25/2020 Document Reviewed: 08/25/2020 Elsevier Patient Education  2021 Elsevier Inc.  

## 2023-06-02 ENCOUNTER — Emergency Department (HOSPITAL_BASED_OUTPATIENT_CLINIC_OR_DEPARTMENT_OTHER): Payer: Managed Care, Other (non HMO) | Admitting: Radiology

## 2023-06-02 ENCOUNTER — Emergency Department (HOSPITAL_BASED_OUTPATIENT_CLINIC_OR_DEPARTMENT_OTHER)
Admission: EM | Admit: 2023-06-02 | Discharge: 2023-06-02 | Disposition: A | Discharge status: Home / Self Care | Payer: Managed Care, Other (non HMO) | Attending: Emergency Medicine | Admitting: Emergency Medicine

## 2023-06-02 ENCOUNTER — Other Ambulatory Visit: Payer: Self-pay

## 2023-06-02 ENCOUNTER — Encounter (HOSPITAL_BASED_OUTPATIENT_CLINIC_OR_DEPARTMENT_OTHER): Payer: Self-pay

## 2023-06-02 DIAGNOSIS — D649 Anemia, unspecified: Secondary | ICD-10-CM | POA: Diagnosis not present

## 2023-06-02 DIAGNOSIS — R1013 Epigastric pain: Secondary | ICD-10-CM | POA: Diagnosis present

## 2023-06-02 DIAGNOSIS — I10 Essential (primary) hypertension: Secondary | ICD-10-CM | POA: Insufficient documentation

## 2023-06-02 DIAGNOSIS — R1012 Left upper quadrant pain: Secondary | ICD-10-CM | POA: Insufficient documentation

## 2023-06-02 LAB — CBC
HCT: 35.7 % — ABNORMAL LOW (ref 36.0–46.0)
Hemoglobin: 11.9 g/dL — ABNORMAL LOW (ref 12.0–15.0)
MCH: 26.8 pg (ref 26.0–34.0)
MCHC: 33.3 g/dL (ref 30.0–36.0)
MCV: 80.4 fL (ref 80.0–100.0)
Platelets: 251 10*3/uL (ref 150–400)
RBC: 4.44 MIL/uL (ref 3.87–5.11)
RDW: 13.3 % (ref 11.5–15.5)
WBC: 9.9 10*3/uL (ref 4.0–10.5)
nRBC: 0 % (ref 0.0–0.2)

## 2023-06-02 LAB — HEPATIC FUNCTION PANEL
ALT: 20 U/L (ref 0–44)
AST: 27 U/L (ref 15–41)
Albumin: 4.2 g/dL (ref 3.5–5.0)
Alkaline Phosphatase: 85 U/L (ref 38–126)
Bilirubin, Direct: 0.1 mg/dL (ref 0.0–0.2)
Indirect Bilirubin: 0.2 mg/dL — ABNORMAL LOW (ref 0.3–0.9)
Total Bilirubin: 0.3 mg/dL (ref 0.3–1.2)
Total Protein: 7.1 g/dL (ref 6.5–8.1)

## 2023-06-02 LAB — TROPONIN I (HIGH SENSITIVITY)
Troponin I (High Sensitivity): 4 ng/L (ref <18)
Troponin I (High Sensitivity): 4 ng/L (ref <18)

## 2023-06-02 LAB — BASIC METABOLIC PANEL
Anion gap: 11 (ref 5–15)
BUN: 15 mg/dL (ref 6–20)
CO2: 24 mmol/L (ref 22–32)
Calcium: 9.3 mg/dL (ref 8.9–10.3)
Chloride: 101 mmol/L (ref 98–111)
Creatinine, Ser: 0.68 mg/dL (ref 0.44–1.00)
GFR, Estimated: >60 mL/min (ref >60)
Glucose, Bld: 121 mg/dL — ABNORMAL HIGH (ref 70–99)
Potassium: 3.3 mmol/L — ABNORMAL LOW (ref 3.5–5.1)
Sodium: 136 mmol/L (ref 135–145)

## 2023-06-02 LAB — PREGNANCY, URINE: Preg Test, Ur: NEGATIVE

## 2023-06-02 LAB — LIPASE, BLOOD: Lipase: 14 U/L (ref 11–51)

## 2023-06-02 MED ORDER — LIDOCAINE VISCOUS HCL 2 % MT SOLN
15.0000 mL | Freq: Once | OROMUCOSAL | Status: AC
  Administered 2023-06-02: 15 mL via ORAL
  Filled 2023-06-02: qty 15

## 2023-06-02 MED ORDER — LIDOCAINE VISCOUS HCL 2 % MT SOLN: 10.0000 mL | Freq: Four times a day (QID) | OROMUCOSAL | 0 refills | Status: AC | PRN

## 2023-06-02 MED ORDER — MAALOX MAX 400-400-40 MG/5ML PO SUSP: 15.0000 mL | Freq: Four times a day (QID) | ORAL | 0 refills | Status: AC | PRN

## 2023-06-02 MED ORDER — ALUM & MAG HYDROXIDE-SIMETH 200-200-20 MG/5ML PO SUSP
30.0000 mL | Freq: Once | ORAL | Status: AC
  Administered 2023-06-02: 30 mL via ORAL
  Filled 2023-06-02: qty 30

## 2023-06-02 MED ORDER — ONDANSETRON 4 MG PO TBDP: 4.0000 mg | ORAL_TABLET | Freq: Three times a day (TID) | ORAL | 0 refills | Status: AC | PRN

## 2023-06-02 MED ORDER — SUCRALFATE 1 GM/10ML PO SUSP: 1.0000 g | Freq: Three times a day (TID) | ORAL | 0 refills | Status: AC | PRN

## 2023-06-02 NOTE — ED Notes (Signed)
Discharge instructions discussed with pt. Pt verbalized understanding. Pt stable and ambulatory.  °

## 2023-06-02 NOTE — ED Provider Notes (Signed)
[place] EMERGENCY DEPARTMENT AT [facility] Provider Note  CSN: [id number] Arrival date & time: 06/02/23 0140  Chief Complaint(s) Chest Pain  HPI Stephanie Wilkinson is a 32 y.o. female with a past medical history of hypertension and GERD who presents to the emergency department with epigastric abdominal pain described as tightness radiating up to the chest associated with several bouts of nonbloody nonbilious emesis.  No associated shortness of breath.  No recent fevers or infections.  No coughing or congestion.  No known suspicious food intake.  No exertional chest pain.  Pain was nonradiating.  No prior history of PE/DVT, no recent immobilization.  No OCP use.  The history is provided by the patient.    Past Medical History Past Medical History:  Diagnosis Date   Medical history non-contributory    Patient Active Problem List   Diagnosis Date Noted   Benign essential hypertension antepartum in third trimester 01/31/2021   Spontaneous vaginal delivery 01/31/2021   Term pregnancy 01/11/2017   Home Medication(s) Prior to Admission medications   Medication Sig Start Date End Date Taking? Authorizing Provider  alum & mag hydroxide-simeth (MAALOX MAX) 400-400-40 MG/5ML suspension Take 15 mLs by mouth every 6 (six) hours as needed for indigestion. 06/02/23  Yes Jovonna Nickell, Amadeo Garnet, MD  lidocaine (XYLOCAINE) 2 % solution Use as directed 10 mLs in the mouth or throat every 6 (six) hours as needed (stomach pain). 06/02/23  Yes Lincoln Kleiner, Amadeo Garnet, MD  ondansetron (ZOFRAN-ODT) 4 MG disintegrating tablet Take 1 tablet (4 mg total) by mouth every 8 (eight) hours as needed for up to 3 days for nausea or vomiting. 06/02/23 06/05/23 Yes Joline Encalada, Amadeo Garnet, MD  sucralfate (CARAFATE) 1 GM/10ML suspension Take 10 mLs (1 g total) by mouth 3 (three) times daily with meals as needed. 06/02/23  Yes Rushi Chasen, Amadeo Garnet, MD  acetaminophen (TYLENOL) 325 MG tablet Take 2 tablets (650 mg  total) by mouth every 6 (six) hours as needed (for pain scale < 4). 02/01/21   Charlett Nose, MD  famotidine (PEPCID) 20 MG tablet Take 1 tablet (20 mg total) by mouth 2 (two) times daily. 11/27/20   Gerrit Heck, CNM  ibuprofen (ADVIL) 600 MG tablet Take 1 tablet (600 mg total) by mouth every 6 (six) hours as needed. 02/01/21   Charlett Nose, MD  Omega-3 Fatty Acids (FISH OIL) 1200 MG CAPS Take 1,200 mg by mouth daily.    [provider]  Prenatal Vit-Fe Fumarate-FA (PRENATAL MULTIVITAMIN) TABS tablet Take 1 tablet by mouth daily at 12 noon.    [provider]  senna-docusate (SENOKOT-S) 8.6-50 MG tablet Take 2 tablets by mouth daily. 02/02/21   Charlett Nose, MD  vitamin B-12 (CYANOCOBALAMIN) 1000 MCG tablet Take 1,000 mcg by mouth daily.    [provider]  Allergies Patient has no known allergies.  Review of Systems Review of Systems As noted in HPI  Physical Exam Vital Signs  I have reviewed the triage vital signs BP 134/68   Pulse 72   Temp 98.2 F (36.8 C)   Resp 20   Ht 5\' 10"  (1.778 m)   Wt 134.3 kg   LMP 05/26/2023 (Approximate)   SpO2 99%   BMI 42.47 kg/m   Physical Exam Vitals reviewed.  Constitutional:      General: She is not in acute distress.    Appearance: She is well-developed. She is obese. She is not diaphoretic.  HENT:     Head: Normocephalic and atraumatic.     Right Ear: External ear normal.     Left Ear: External ear normal.     Nose: Nose normal.  Eyes:     General: No scleral icterus.    Conjunctiva/sclera: Conjunctivae normal.  Neck:     Trachea: Phonation normal.  Cardiovascular:     Rate and Rhythm: Regular rhythm.  Pulmonary:     Effort: Pulmonary effort is normal. No respiratory distress.     Breath sounds: No stridor.  Abdominal:     General: There is no  distension.     Tenderness: There is abdominal tenderness in the epigastric area and left upper quadrant.  Musculoskeletal:        General: Normal range of motion.     Cervical back: Normal range of motion.  Neurological:     Mental Status: She is alert and oriented to person, place, and time.  Psychiatric:        Behavior: Behavior normal.     ED Results and Treatments Labs (all labs ordered are listed, but only abnormal results are displayed) Labs Reviewed  BASIC METABOLIC PANEL - Abnormal; Notable for the following components:      Result Value   Potassium 3.3 (*)    Glucose, Bld 121 (*)    All other components within normal limits  CBC - Abnormal; Notable for the following components:   Hemoglobin 11.9 (*)    HCT 35.7 (*)    All other components within normal limits  HEPATIC FUNCTION PANEL - Abnormal; Notable for the following components:   Indirect Bilirubin 0.2 (*)    All other components within normal limits  PREGNANCY, URINE  LIPASE, BLOOD  TROPONIN I (HIGH SENSITIVITY)  TROPONIN I (HIGH SENSITIVITY)                                                                                                                         EKG  EKG Interpretation  Date/Time:  Sunday June 02 2023 01:47:58 EDT Ventricular Rate:  84 PR Interval:  146 QRS Duration: 92 QT Interval:  380 QTC Calculation: 449 R Axis:   93 Text Interpretation: Normal sinus rhythm Rightward axis Borderline ECG When compared with ECG of 26-Nov-2020 20:34, T wave amplitude has decreased in Lateral leads Otherwise no significant change Confirmed  by Drema Pry [phone]) on 06/02/2023 3:22:37 AM       Radiology DG Chest Port 1 View  Result Date: 06/02/2023 CLINICAL DATA:  Chest pain EXAM: PORTABLE CHEST 1 VIEW COMPARISON:  None Available. FINDINGS: The heart size and mediastinal contours are within normal limits. Both lungs are clear. The visualized skeletal structures are unremarkable. IMPRESSION: No active  disease. Electronically Signed   By: Minerva Fester M.D.   On: 06/02/2023 03:12    Medications Ordered in ED Medications  alum & mag hydroxide-simeth (MAALOX/MYLANTA) 200-200-20 MG/5ML suspension 30 mL (30 mLs Oral Given 06/02/23 0336)    And  lidocaine (XYLOCAINE) 2 % viscous mouth solution 15 mL (15 mLs Oral Given 06/02/23 0335)   Procedures Procedures  (including critical care time) Medical Decision Making / ED Course   Medical Decision Making Amount and/or Complexity of Data Reviewed Labs: ordered. Radiology: ordered.  Risk OTC drugs. Prescription drug management.    Epigastric/chest pain  Presentation is most suspicious for GERD/esophageal spasm.  Will assess for ACS but low suspicion.  Presentation is not classic for aortic dissection or esophageal perforation.  Low pretest probability for PE and patient is PERC negative.  Chest x-ray will assess for evidence of pneumonia, pneumothorax, pneumomediastinum.  Also considering biliary disease/pancreatitis.  EKG without acute ischemic changes or evidence of pericarditis. Initial troponin negative Heart score less than 3 and patient appropriate for delta troponin. Delta trop negative.  CBC without leukocytosis.  Mild anemia. Metabolic panel with mild hypokalemia likely secondary to GI losses from recent emesis.  No other significant electrolyte derangement or renal sufficiency.  No evidence of bili obstruction or pancreatitis. UPT obtained who assist with guiding care and negative. Chest x-ray without evidence of pneumonia, pneumothorax, pulmonary edema or pleural effusions.  Patient provided with GI cocktail resulting in significant improvement      Final Clinical Impression(s) / ED Diagnoses Final diagnoses:  Epigastric pain   The patient appears reasonably screened and/or stabilized for discharge and I doubt any other medical condition or other [facility] requiring further screening, evaluation, or treatment in the ED at this  time. I have discussed the findings, Dx and Tx plan with the patient/family who expressed understanding and agree(s) with the plan. Discharge instructions discussed at length. The patient/family was given strict return precautions who verbalized understanding of the instructions. No further questions at time of discharge.  Disposition: Discharge  Condition: Good  ED Discharge Orders          Ordered    alum & mag hydroxide-simeth (MAALOX MAX) 400-400-40 MG/5ML suspension  Every 6 hours PRN        06/02/23 0512    lidocaine (XYLOCAINE) 2 % solution  Every 6 hours PRN        06/02/23 0512    ondansetron (ZOFRAN-ODT) 4 MG disintegrating tablet  Every 8 hours PRN        06/02/23 0512    sucralfate (CARAFATE) 1 GM/10ML suspension  3 times daily with meals PRN        06/02/23 9147              Follow Up: [facility] [address] [place] [address] [phone]  Call  to schedule an appointment for close follow up    This chart was dictated using voice recognition software.  Despite best efforts to proofread,  errors can occur which can change the documentation meaning.    Nira Conn, MD 06/02/23  0512  

## 2023-06-02 NOTE — ED Triage Notes (Signed)
Pt POV from home reporting stabbing midsternal chest pain that awoke her from her sleep. Pt reported being diaphoretic and vomiting 3 times PTA.

## 2023-11-07 ENCOUNTER — Emergency Department (HOSPITAL_BASED_OUTPATIENT_CLINIC_OR_DEPARTMENT_OTHER)
Admission: EM | Admit: 2023-11-07 | Discharge: 2023-11-07 | Disposition: A | Discharge status: Home / Self Care | Payer: Managed Care, Other (non HMO) | Attending: Emergency Medicine | Admitting: Emergency Medicine

## 2023-11-07 ENCOUNTER — Emergency Department (HOSPITAL_BASED_OUTPATIENT_CLINIC_OR_DEPARTMENT_OTHER): Payer: Managed Care, Other (non HMO)

## 2023-11-07 ENCOUNTER — Other Ambulatory Visit: Payer: Self-pay

## 2023-11-07 ENCOUNTER — Encounter (HOSPITAL_BASED_OUTPATIENT_CLINIC_OR_DEPARTMENT_OTHER): Payer: Self-pay | Admitting: Emergency Medicine

## 2023-11-07 DIAGNOSIS — R1013 Epigastric pain: Secondary | ICD-10-CM | POA: Insufficient documentation

## 2023-11-07 DIAGNOSIS — D649 Anemia, unspecified: Secondary | ICD-10-CM | POA: Insufficient documentation

## 2023-11-07 DIAGNOSIS — R112 Nausea with vomiting, unspecified: Secondary | ICD-10-CM | POA: Insufficient documentation

## 2023-11-07 DIAGNOSIS — R1011 Right upper quadrant pain: Secondary | ICD-10-CM | POA: Diagnosis not present

## 2023-11-07 LAB — HEPATITIS PANEL, ACUTE
HCV Ab: NONREACTIVE
Hep A IgM: NONREACTIVE
Hep B C IgM: NONREACTIVE
Hepatitis B Surface Ag: NONREACTIVE

## 2023-11-07 LAB — COMPREHENSIVE METABOLIC PANEL
ALT: 454 U/L — ABNORMAL HIGH (ref 0–44)
AST: 691 U/L — ABNORMAL HIGH (ref 15–41)
Albumin: 4.3 g/dL (ref 3.5–5.0)
Alkaline Phosphatase: 150 U/L — ABNORMAL HIGH (ref 38–126)
Anion gap: 7 (ref 5–15)
BUN: 6 mg/dL (ref 6–20)
CO2: 29 mmol/L (ref 22–32)
Calcium: 9.8 mg/dL (ref 8.9–10.3)
Chloride: 101 mmol/L (ref 98–111)
Creatinine, Ser: 0.66 mg/dL (ref 0.44–1.00)
GFR, Estimated: >60 mL/min (ref >60)
Glucose, Bld: 135 mg/dL — ABNORMAL HIGH (ref 70–99)
Potassium: 3.7 mmol/L (ref 3.5–5.1)
Sodium: 137 mmol/L (ref 135–145)
Total Bilirubin: 1.8 mg/dL — ABNORMAL HIGH (ref <1.2)
Total Protein: 7.8 g/dL (ref 6.5–8.1)

## 2023-11-07 LAB — CBC
HCT: 36.1 % (ref 36.0–46.0)
Hemoglobin: 11.5 g/dL — ABNORMAL LOW (ref 12.0–15.0)
MCH: 25.2 pg — ABNORMAL LOW (ref 26.0–34.0)
MCHC: 31.9 g/dL (ref 30.0–36.0)
MCV: 79.2 fL — ABNORMAL LOW (ref 80.0–100.0)
Platelets: 275 10*3/uL (ref 150–400)
RBC: 4.56 MIL/uL (ref 3.87–5.11)
RDW: 14 % (ref 11.5–15.5)
WBC: 7.5 10*3/uL (ref 4.0–10.5)
nRBC: 0 % (ref 0.0–0.2)

## 2023-11-07 LAB — URINALYSIS, ROUTINE W REFLEX MICROSCOPIC
Glucose, UA: NEGATIVE mg/dL
Hgb urine dipstick: NEGATIVE
Ketones, ur: NEGATIVE mg/dL
Leukocytes,Ua: NEGATIVE
Nitrite: NEGATIVE
Protein, ur: NEGATIVE mg/dL
Specific Gravity, Urine: 1.019 (ref 1.005–1.030)
pH: 6 (ref 5.0–8.0)

## 2023-11-07 LAB — PREGNANCY, URINE: Preg Test, Ur: NEGATIVE

## 2023-11-07 LAB — LIPASE, BLOOD: Lipase: 16 U/L (ref 11–51)

## 2023-11-07 LAB — ACETAMINOPHEN LEVEL: Acetaminophen (Tylenol), Serum: <10 ug/mL — ABNORMAL LOW (ref 10–30)

## 2023-11-07 MED ORDER — ONDANSETRON HCL 4 MG/2ML IJ SOLN
4.0000 mg | Freq: Once | INTRAMUSCULAR | Status: AC
  Administered 2023-11-07: 4 mg via INTRAVENOUS
  Filled 2023-11-07: qty 2

## 2023-11-07 MED ORDER — SODIUM CHLORIDE 0.9 % IV BOLUS
1000.0000 mL | Freq: Once | INTRAVENOUS | Status: AC
Start: 2023-11-07 — End: 2023-11-07
  Administered 2023-11-07: 1000 mL via INTRAVENOUS

## 2023-11-07 NOTE — ED Triage Notes (Signed)
Burning in epigastric area. Started last night N/v+ Unable to keep anything down

## 2023-11-07 NOTE — Discharge Instructions (Addendum)
Please call [facility] gastroenterology tomorrow to schedule plan for early next week for repeat blood work to make sure your liver enzymes come down.  Pleasing plenty of fluids and get plenty of rest.  You may return to the emergency department for any worsening symptoms.

## 2023-11-07 NOTE — ED Notes (Signed)
Pt c/o epigastric pain with N/V x 2 days Unable to keep any food or liquids down.

## 2023-11-07 NOTE — ED Provider Notes (Signed)
[place] EMERGENCY DEPARTMENT AT [facility] Provider Note   CSN: [id number] Arrival date & time: 11/07/23  1335     History Chief Complaint  Patient presents with   Abdominal Pain    Stephanie Wilkinson is a 32 y.o. female patient who presents to the emergency department today for further evaluation of epigastric and right upper quadrant abdominal pain.  This particular episode started roughly 24 hours ago.  She states she gets this around every 30 days.  She has seen GI for this and had an endoscopy which was normal.  She is been having intractable nausea and vomiting.  Her nausea has improved with scopolamine patch.  She denies any diarrhea.  Patient does have a history of cholecystectomy.  She denies any excessive Tylenol use, alcohol use, drug use.   Abdominal Pain      Home Medications Prior to Admission medications   Medication Sig Start Date End Date Taking? Authorizing Provider  acetaminophen (TYLENOL) 325 MG tablet Take 2 tablets (650 mg total) by mouth every 6 (six) hours as needed (for pain scale < 4). 02/01/21   Charlett Nose, MD  alum & mag hydroxide-simeth (MAALOX MAX) 400-400-40 MG/5ML suspension Take 15 mLs by mouth every 6 (six) hours as needed for indigestion. 06/02/23   Nira Conn, MD  famotidine (PEPCID) 20 MG tablet Take 1 tablet (20 mg total) by mouth 2 (two) times daily. 11/27/20   Gerrit Heck, CNM  ibuprofen (ADVIL) 600 MG tablet Take 1 tablet (600 mg total) by mouth every 6 (six) hours as needed. 02/01/21   Charlett Nose, MD  lidocaine (XYLOCAINE) 2 % solution Use as directed 10 mLs in the mouth or throat every 6 (six) hours as needed (stomach pain). 06/02/23   Cardama, Amadeo Garnet, MD  Omega-3 Fatty Acids (FISH OIL) 1200 MG CAPS Take 1,200 mg by mouth daily.    [provider]  Prenatal Vit-Fe Fumarate-FA (PRENATAL MULTIVITAMIN) TABS tablet Take 1 tablet by mouth daily at 12 noon.    [provider]   senna-docusate (SENOKOT-S) 8.6-50 MG tablet Take 2 tablets by mouth daily. 02/02/21   Charlett Nose, MD  sucralfate (CARAFATE) 1 GM/10ML suspension Take 10 mLs (1 g total) by mouth 3 (three) times daily with meals as needed. 06/02/23   CardamaAmadeo Garnet, MD  vitamin B-12 (CYANOCOBALAMIN) 1000 MCG tablet Take 1,000 mcg by mouth daily.    [provider]      Allergies    Patient has no known allergies.    Review of Systems   Review of Systems  Gastrointestinal:  Positive for abdominal pain.  All other systems reviewed and are negative.   Physical Exam Updated Vital Signs BP 138/65   Pulse 62   Temp 98 F (36.7 C) (Oral)   Resp (!) 21   LMP 10/28/2023 (Approximate)   SpO2 100%  Physical Exam Vitals and nursing note reviewed.  Constitutional:      General: She is not in acute distress.    Appearance: Normal appearance.  HENT:     Head: Normocephalic and atraumatic.  Eyes:     General:        Right eye: No discharge.        Left eye: No discharge.  Cardiovascular:     Comments: Regular rate and rhythm.  S1/S2 are distinct without any evidence of murmur, rubs, or gallops.  Radial pulses are 2+ bilaterally.  Dorsalis pedis pulses are 2+ bilaterally.  No evidence of pedal edema. Pulmonary:     Comments: Clear to auscultation bilaterally.  Normal effort.  No respiratory distress.  No evidence of wheezes, rales, or rhonchi heard throughout. Abdominal:     General: Abdomen is flat. Bowel sounds are normal. There is no distension.     Tenderness: There is abdominal tenderness in the right upper quadrant and epigastric area. There is no guarding or rebound.  Musculoskeletal:        General: Normal range of motion.     Cervical back: Neck supple.  Skin:    General: Skin is warm and dry.     Findings: No rash.  Neurological:     General: No focal deficit present.     Mental Status: She is alert.  Psychiatric:        Mood and Affect: Mood normal.         Behavior: Behavior normal.     ED Results / Procedures / Treatments   Labs (all labs ordered are listed, but only abnormal results are displayed) Labs Reviewed  COMPREHENSIVE METABOLIC PANEL - Abnormal; Notable for the following components:      Result Value   Glucose, Bld 135 (*)    AST 691 (*)    ALT 454 (*)    Alkaline Phosphatase 150 (*)    Total Bilirubin 1.8 (*)    All other components within normal limits  CBC - Abnormal; Notable for the following components:   Hemoglobin 11.5 (*)    MCV 79.2 (*)    MCH 25.2 (*)    All other components within normal limits  URINALYSIS, ROUTINE W REFLEX MICROSCOPIC - Abnormal; Notable for the following components:   Bilirubin Urine SMALL (*)    All other components within normal limits  ACETAMINOPHEN LEVEL - Abnormal; Notable for the following components:   Acetaminophen (Tylenol), Serum <10 (*)    All other components within normal limits  LIPASE, BLOOD  PREGNANCY, URINE  HEPATITIS PANEL, ACUTE    EKG EKG Interpretation Date/Time:  Thursday November 07 2023 14:03:20 EST Ventricular Rate:  74 PR Interval:  136 QRS Duration:  88 QT Interval:  404 QTC Calculation: 448 R Axis:   95  Text Interpretation: Normal sinus rhythm Rightward axis Borderline ECG No significant change since last tracing Confirmed by Melene Plan [phone]) on 11/07/2023 5:28:03 PM  Radiology US Abdomen Limited RUQ (LIVER/GB)  Result Date: 11/07/2023 CLINICAL DATA:  Elevated liver function tests Epigastric pain EXAM: ULTRASOUND ABDOMEN LIMITED RIGHT UPPER QUADRANT COMPARISON:  None available FINDINGS: Gallbladder: Status post cholecystectomy Common bile duct: Diameter: 5 mm Liver: Parenchymal echogenicity: Homogeneously increased Contours: Normal Lesions: None Portal vein: Patent.  Hepatopetal flow Other: None. IMPRESSION: 1. No acute abnormality of the liver. 2. Diffuse increased echogenicity of the hepatic parenchyma is a nonspecific indicator of hepatocellular  dysfunction, most commonly steatosis. Electronically Signed   By: Acquanetta Belling M.D.   On: 11/07/2023 20:15    Procedures Procedures    Medications Ordered in ED Medications  sodium chloride 0.9 % bolus 1,000 mL (1,000 mLs Intravenous New Bag/Given 11/07/23 1654)  ondansetron (ZOFRAN) injection 4 mg (4 mg Intravenous Given 11/07/23 1727)    ED Course/ Medical Decision Making/ A&P Clinical Course as of 11/07/23 2040  Thu Nov 07, 2023  2019 Hepatitis panel, acute Negative.  [CF]  2019 Acetaminophen level(!) Negative.  [CF]  2019 CBC(!) Mild anemia.  Seems to be at the patient's baseline.  No evidence of leukocytosis. [CF]  2019  Urinalysis, Routine w reflex microscopic -Urine, Clean Catch(!) Normal. [CF]  2019 Comprehensive metabolic panel(!) There is evidence of significant transaminitis. [CF]  2019 Pregnancy, urine Negative. [CF]  2029 I spoke with Dr. Levora Angel with [facility] gastroenterology who does agree that the patient can have repeat liver enzymes early next week.  No need to admit the patient today.  We reviewed the ultrasound and lab work. [CF]  2030 Lipase, blood Negative [CF]  2031 Pregnancy, urine Negative.  [CF]  2038 On reevaluation, patient is feeling much better.  She is able to tolerate p.o. fluids.  We went over all labs and imaging results with the patient at the bedside.  Strict return precautions were discussed.  She will follow-up with [facility] for repeat blood work early next week.  All questions and concerns addressed. [CF]    Clinical Course User Index [CF] Teressa Lower, PA-C   {   Click here for ABCD2, HEART and other calculators  Medical Decision Making JAYLAA NODA is a 32 y.o. female patient who presents to the emergency department today for further evaluation of epigastric abdominal pain, intractable nausea and vomiting, and right upper quadrant abdominal pain.  Labs were obtained in triage and there is evidence of significant  transaminitis.  Will likely get a right upper quadrant ultrasound to further assess.  I will also add on Tylenol level although unlikely and a viral hepatitis panel to further assess.   As [facility] ED course, we will follow-up in the outpatient setting.  Liver enzymes will be repeated early next week to evaluate downtrend.  Ultrasound was normal.  No evidence of stone.  Common bile duct was normal.  Strict return precaution were discussed.  Patient feeling better.  She is safe for discharge.  Amount and/or Complexity of Data Reviewed Labs: ordered. Decision-making details documented in ED Course. Radiology: ordered.  Risk Prescription drug management.    Final Clinical Impression(s) / ED Diagnoses Final diagnoses:  Epigastric abdominal pain  Nausea and vomiting, unspecified vomiting type    Rx / DC Orders ED Discharge Orders     None         Teressa Lower, PA-C 11/07/23 2040    Melene Plan, DO 11/07/23 2119
